# Patient Record
Sex: Female | Born: 1940 | Race: Black or African American | Hispanic: No | Smoking: Never smoker
Health system: Southern US, Community
[De-identification: ages and names within clinical notes are randomized; demographics above are authoritative.]

## PROBLEM LIST (undated history)

## (undated) DIAGNOSIS — E785 Hyperlipidemia, unspecified: Secondary | ICD-10-CM

## (undated) DIAGNOSIS — I10 Essential (primary) hypertension: Secondary | ICD-10-CM

## (undated) DIAGNOSIS — K219 Gastro-esophageal reflux disease without esophagitis: Secondary | ICD-10-CM

## (undated) DIAGNOSIS — Z86718 Personal history of other venous thrombosis and embolism: Secondary | ICD-10-CM

## (undated) DIAGNOSIS — E119 Type 2 diabetes mellitus without complications: Secondary | ICD-10-CM

## (undated) HISTORY — DX: Hyperlipidemia, unspecified: E78.5

## (undated) HISTORY — DX: Type 2 diabetes mellitus without complications: E11.9

## (undated) HISTORY — PX: CHOLECYSTECTOMY: SHX55

## (undated) HISTORY — DX: Personal history of other venous thrombosis and embolism: Z86.718

## (undated) HISTORY — PX: ABDOMINAL HYSTERECTOMY: SHX81

## (undated) HISTORY — PX: TONSILLECTOMY: SUR1361

## (undated) HISTORY — DX: Gastro-esophageal reflux disease without esophagitis: K21.9

## (undated) HISTORY — PX: SHOULDER SURGERY: SHX246

## (undated) HISTORY — DX: Essential (primary) hypertension: I10

---

## 2005-05-03 ENCOUNTER — Ambulatory Visit: Payer: Self-pay | Admitting: Nurse Practitioner

## 2005-06-21 ENCOUNTER — Emergency Department: Payer: Self-pay | Admitting: Unknown Physician Specialty

## 2005-07-13 ENCOUNTER — Ambulatory Visit: Payer: Self-pay | Admitting: Unknown Physician Specialty

## 2005-10-19 ENCOUNTER — Ambulatory Visit: Payer: Self-pay | Admitting: Unknown Physician Specialty

## 2006-05-09 ENCOUNTER — Ambulatory Visit: Payer: Self-pay | Admitting: Nurse Practitioner

## 2007-03-01 ENCOUNTER — Emergency Department: Payer: Self-pay | Admitting: Emergency Medicine

## 2007-05-12 ENCOUNTER — Ambulatory Visit: Payer: Self-pay | Admitting: Family Medicine

## 2007-05-12 ENCOUNTER — Emergency Department: Payer: Self-pay | Admitting: Internal Medicine

## 2008-02-16 ENCOUNTER — Ambulatory Visit: Payer: Self-pay | Admitting: Family Medicine

## 2008-05-11 ENCOUNTER — Ambulatory Visit: Payer: Self-pay | Admitting: Family Medicine

## 2008-05-12 ENCOUNTER — Ambulatory Visit: Payer: Self-pay | Admitting: Family Medicine

## 2008-05-20 ENCOUNTER — Ambulatory Visit: Payer: Self-pay | Admitting: Cardiovascular Disease

## 2008-06-11 ENCOUNTER — Ambulatory Visit: Payer: Self-pay | Admitting: Family Medicine

## 2008-06-22 ENCOUNTER — Ambulatory Visit: Payer: Self-pay | Admitting: Family Medicine

## 2008-07-15 ENCOUNTER — Ambulatory Visit: Payer: Self-pay | Admitting: Gastroenterology

## 2008-09-23 ENCOUNTER — Ambulatory Visit: Payer: Self-pay | Admitting: Internal Medicine

## 2009-06-16 ENCOUNTER — Emergency Department: Payer: Self-pay | Admitting: Emergency Medicine

## 2009-06-16 ENCOUNTER — Ambulatory Visit: Payer: Self-pay | Admitting: Family Medicine

## 2009-06-21 ENCOUNTER — Ambulatory Visit: Payer: Self-pay | Admitting: Family Medicine

## 2009-06-22 ENCOUNTER — Ambulatory Visit: Payer: Self-pay | Admitting: Family Medicine

## 2009-09-16 ENCOUNTER — Ambulatory Visit: Payer: Self-pay | Admitting: Internal Medicine

## 2010-01-12 ENCOUNTER — Ambulatory Visit: Payer: Self-pay | Admitting: Family Medicine

## 2010-03-06 ENCOUNTER — Ambulatory Visit: Payer: Self-pay | Admitting: Internal Medicine

## 2010-03-11 ENCOUNTER — Inpatient Hospital Stay: Payer: Self-pay | Admitting: Internal Medicine

## 2010-03-14 LAB — PROT IMMUNOELECTROPHORES(ARMC)

## 2010-03-15 LAB — CEA: CEA: 1.1 ng/mL (ref 0.0–4.7)

## 2010-04-04 ENCOUNTER — Observation Stay: Payer: Self-pay | Admitting: Internal Medicine

## 2010-04-06 ENCOUNTER — Ambulatory Visit: Payer: Self-pay | Admitting: Internal Medicine

## 2010-10-12 ENCOUNTER — Ambulatory Visit: Payer: Self-pay | Admitting: Internal Medicine

## 2010-10-31 ENCOUNTER — Ambulatory Visit: Payer: Self-pay | Admitting: Internal Medicine

## 2011-01-14 ENCOUNTER — Emergency Department: Payer: Self-pay | Admitting: Emergency Medicine

## 2011-02-14 ENCOUNTER — Ambulatory Visit: Payer: Self-pay | Admitting: Family Medicine

## 2011-02-16 ENCOUNTER — Emergency Department: Payer: Self-pay | Admitting: Emergency Medicine

## 2011-07-28 ENCOUNTER — Ambulatory Visit: Payer: Self-pay

## 2011-08-03 ENCOUNTER — Emergency Department: Payer: Self-pay | Admitting: Emergency Medicine

## 2011-10-08 ENCOUNTER — Ambulatory Visit: Payer: Self-pay

## 2011-11-03 ENCOUNTER — Emergency Department: Payer: Self-pay | Admitting: Emergency Medicine

## 2011-11-03 LAB — COMPREHENSIVE METABOLIC PANEL
Albumin: 3.9 g/dL (ref 3.4–5.0)
Alkaline Phosphatase: 60 U/L (ref 50–136)
Bilirubin,Total: 0.6 mg/dL (ref 0.2–1.0)
Calcium, Total: 9.6 mg/dL (ref 8.5–10.1)
Co2: 27 mmol/L (ref 21–32)
Creatinine: 1.1 mg/dL (ref 0.60–1.30)
EGFR (Non-African Amer.): 52 — ABNORMAL LOW
Glucose: 171 mg/dL — ABNORMAL HIGH (ref 65–99)
Osmolality: 285 (ref 275–301)
Potassium: 3.2 mmol/L — ABNORMAL LOW (ref 3.5–5.1)
SGOT(AST): 32 U/L (ref 15–37)
SGPT (ALT): 27 U/L
Total Protein: 8 g/dL (ref 6.4–8.2)

## 2011-11-03 LAB — CBC
HCT: 41.2 % (ref 35.0–47.0)
HGB: 13.6 g/dL (ref 12.0–16.0)
MCHC: 33.1 g/dL (ref 32.0–36.0)
Platelet: 319 10*3/uL (ref 150–440)
RBC: 5.16 10*6/uL (ref 3.80–5.20)

## 2011-11-21 ENCOUNTER — Ambulatory Visit: Payer: Self-pay | Admitting: Family Medicine

## 2011-12-08 ENCOUNTER — Emergency Department: Payer: Self-pay | Admitting: Emergency Medicine

## 2012-04-10 ENCOUNTER — Ambulatory Visit: Payer: Self-pay | Admitting: Specialist

## 2012-04-10 LAB — BASIC METABOLIC PANEL
BUN: 16 mg/dL (ref 7–18)
Calcium, Total: 9.7 mg/dL (ref 8.5–10.1)
Chloride: 106 mmol/L (ref 98–107)
Creatinine: 1.04 mg/dL (ref 0.60–1.30)
EGFR (African American): >60
EGFR (Non-African Amer.): 54 — ABNORMAL LOW
Glucose: 128 mg/dL — ABNORMAL HIGH (ref 65–99)
Osmolality: 286 (ref 275–301)
Sodium: 142 mmol/L (ref 136–145)

## 2012-04-10 LAB — HEMOGLOBIN: HGB: 12.5 g/dL (ref 12.0–16.0)

## 2012-04-16 ENCOUNTER — Ambulatory Visit: Payer: Self-pay | Admitting: Specialist

## 2012-04-16 LAB — PROTIME-INR: INR: 0.9

## 2012-08-06 DIAGNOSIS — Z86718 Personal history of other venous thrombosis and embolism: Secondary | ICD-10-CM

## 2012-08-06 HISTORY — PX: OTHER SURGICAL HISTORY: SHX169

## 2012-08-06 HISTORY — DX: Personal history of other venous thrombosis and embolism: Z86.718

## 2012-09-24 ENCOUNTER — Emergency Department: Payer: Self-pay | Admitting: Emergency Medicine

## 2012-09-24 LAB — CBC
HCT: 42.2 % (ref 35.0–47.0)
MCH: 26 pg (ref 26.0–34.0)
MCHC: 32.4 g/dL (ref 32.0–36.0)
RBC: 5.27 10*6/uL — ABNORMAL HIGH (ref 3.80–5.20)
WBC: 9.6 10*3/uL (ref 3.6–11.0)

## 2012-09-24 LAB — COMPREHENSIVE METABOLIC PANEL
Albumin: 3.7 g/dL (ref 3.4–5.0)
Alkaline Phosphatase: 72 U/L (ref 50–136)
Anion Gap: 9 (ref 7–16)
Bilirubin,Total: 0.9 mg/dL (ref 0.2–1.0)
Calcium, Total: 9.3 mg/dL (ref 8.5–10.1)
Chloride: 104 mmol/L (ref 98–107)
Co2: 26 mmol/L (ref 21–32)
Creatinine: 0.81 mg/dL (ref 0.60–1.30)
EGFR (Non-African Amer.): >60
Glucose: 143 mg/dL — ABNORMAL HIGH (ref 65–99)
Osmolality: 283 (ref 275–301)
Potassium: 4 mmol/L (ref 3.5–5.1)
SGPT (ALT): 29 U/L (ref 12–78)
Sodium: 139 mmol/L (ref 136–145)
Total Protein: 7.8 g/dL (ref 6.4–8.2)

## 2012-09-24 LAB — PROTIME-INR: Prothrombin Time: 20.2 secs — ABNORMAL HIGH (ref 11.5–14.7)

## 2012-09-24 LAB — URINALYSIS, COMPLETE
Ketone: NEGATIVE
Nitrite: NEGATIVE
Ph: 5 (ref 4.5–8.0)
Protein: NEGATIVE
RBC,UR: 6 /HPF (ref 0–5)

## 2012-09-26 LAB — URINE CULTURE

## 2012-10-08 ENCOUNTER — Ambulatory Visit: Payer: Self-pay | Admitting: Family Medicine

## 2012-11-17 ENCOUNTER — Ambulatory Visit: Payer: Self-pay | Admitting: Orthopedic Surgery

## 2012-12-11 ENCOUNTER — Ambulatory Visit: Payer: Self-pay | Admitting: Family Medicine

## 2012-12-25 ENCOUNTER — Ambulatory Visit: Payer: Self-pay | Admitting: Orthopedic Surgery

## 2012-12-30 ENCOUNTER — Ambulatory Visit: Payer: Self-pay | Admitting: Orthopedic Surgery

## 2013-02-04 ENCOUNTER — Emergency Department: Payer: Self-pay | Admitting: Emergency Medicine

## 2013-02-04 LAB — COMPREHENSIVE METABOLIC PANEL
BUN: 12 mg/dL (ref 7–18)
Calcium, Total: 9.4 mg/dL (ref 8.5–10.1)
Creatinine: 0.89 mg/dL (ref 0.60–1.30)
EGFR (African American): >60
EGFR (Non-African Amer.): >60
Potassium: 3.9 mmol/L (ref 3.5–5.1)
Sodium: 142 mmol/L (ref 136–145)
Total Protein: 7.3 g/dL (ref 6.4–8.2)

## 2013-02-04 LAB — CBC
HCT: 36.2 % (ref 35.0–47.0)
HGB: 11.9 g/dL — ABNORMAL LOW (ref 12.0–16.0)
MCH: 25.8 pg — ABNORMAL LOW (ref 26.0–34.0)
MCHC: 32.8 g/dL (ref 32.0–36.0)
MCV: 79 fL — ABNORMAL LOW (ref 80–100)
Platelet: 283 x10 3/mm 3 (ref 150–440)
RBC: 4.6 X10 6/mm 3 (ref 3.80–5.20)
RDW: 15.4 % — ABNORMAL HIGH (ref 11.5–14.5)
WBC: 7.3 x10 3/mm 3 (ref 3.6–11.0)

## 2013-02-04 LAB — PROTIME-INR: INR: 1.5

## 2013-09-11 IMAGING — CR DG CHEST 2V
1 series · 3 of 3 positions shown · non-contrast
Comparison: none

REASON FOR EXAM: htn
COMMENTS:

PROCEDURE:     DXR - DXR CHEST PA (OR AP) AND LATERAL  - December 25, 2012  [DATE]
RESULT:     Comparison: 04/03/2010

[Series 1: w chest pa · 0.14mm/px · 3 of 3 slices shown]
[im 1/3]
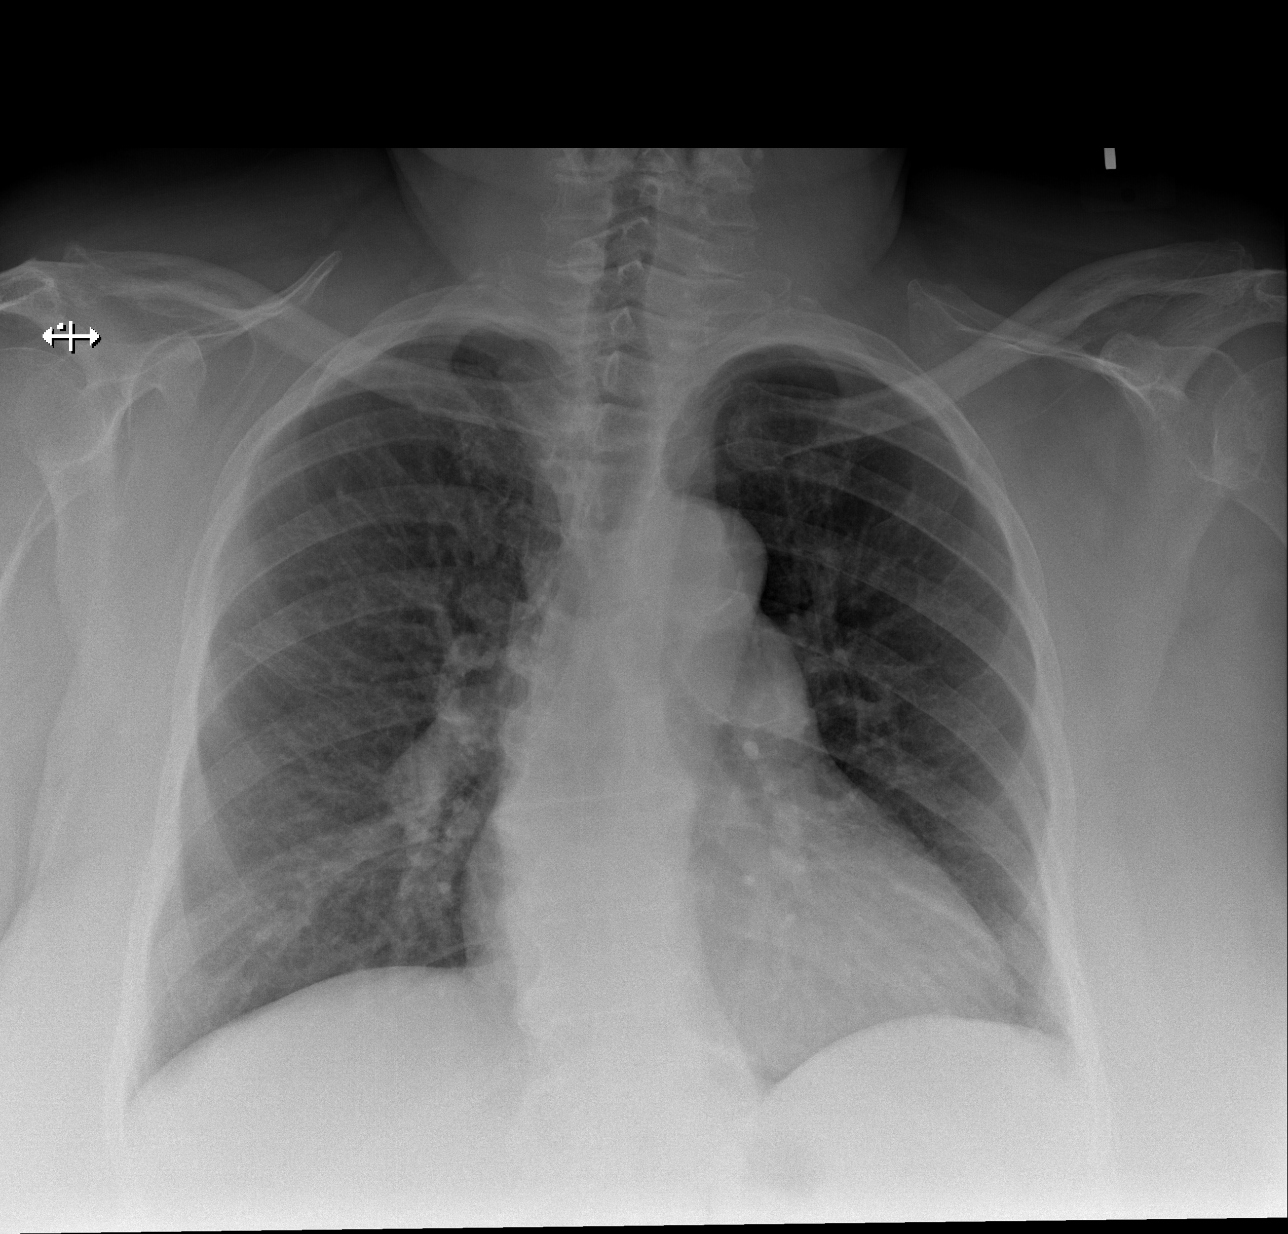
[im 2/3]
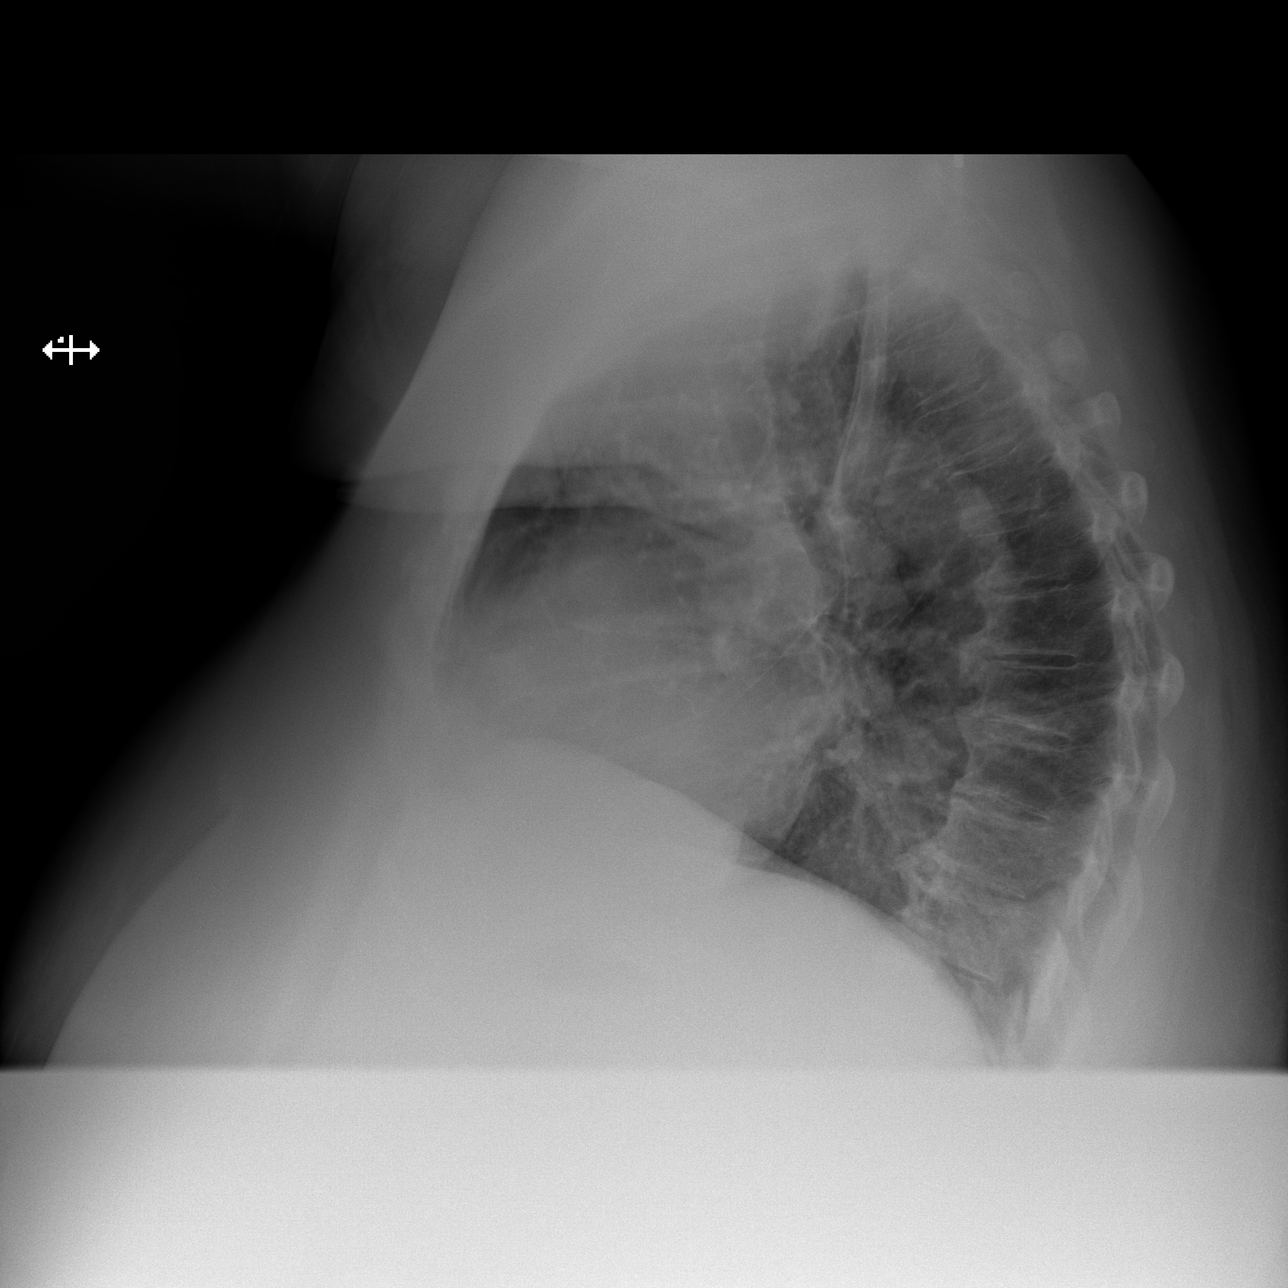
[im 3/3]
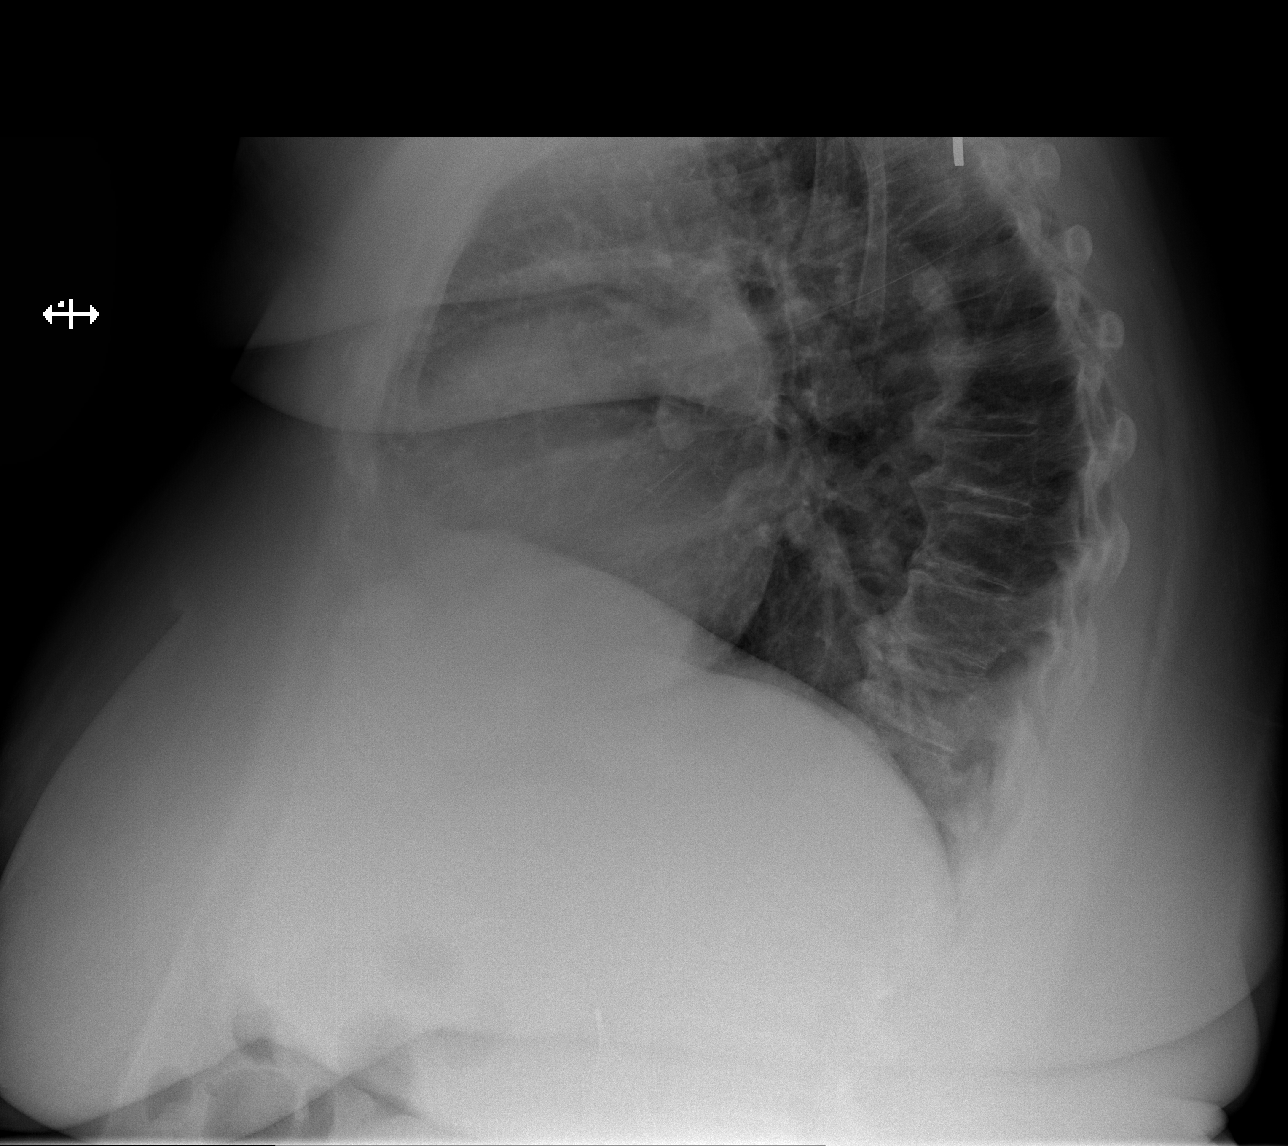

[3 of 3 positions shown; findings below may reference images not displayed]

FINDINGS: The heart and mediastinum are stable. Mild prominence of the pulmonary
interstitium is felt to be technical. No focal pulmonary opacities. There is
a mild dextrocurvature of the thoracic spine. An IVC filter is incompletely
visualized.
IMPRESSION: No acute cardiopulmonary disease.

[REDACTED]

## 2013-11-24 ENCOUNTER — Inpatient Hospital Stay: Payer: Self-pay | Admitting: Internal Medicine

## 2013-11-24 LAB — COMPREHENSIVE METABOLIC PANEL
ALT: 24 U/L (ref 12–78)
Albumin: 3.7 g/dL (ref 3.4–5.0)
Alkaline Phosphatase: 57 U/L
Anion Gap: 3 — ABNORMAL LOW (ref 7–16)
BILIRUBIN TOTAL: 0.6 mg/dL (ref 0.2–1.0)
BUN: 18 mg/dL (ref 7–18)
CALCIUM: 9.1 mg/dL (ref 8.5–10.1)
CO2: 29 mmol/L (ref 21–32)
Chloride: 111 mmol/L — ABNORMAL HIGH (ref 98–107)
Creatinine: 0.71 mg/dL (ref 0.60–1.30)
EGFR (Non-African Amer.): >60
GLUCOSE: 79 mg/dL (ref 65–99)
OSMOLALITY: 286 (ref 275–301)
Potassium: 3.4 mmol/L — ABNORMAL LOW (ref 3.5–5.1)
SGOT(AST): 22 U/L (ref 15–37)
Sodium: 143 mmol/L (ref 136–145)
Total Protein: 7 g/dL (ref 6.4–8.2)

## 2013-11-24 LAB — URINALYSIS, COMPLETE
BILIRUBIN, UR: NEGATIVE
GLUCOSE, UR: NEGATIVE mg/dL (ref 0–75)
KETONE: NEGATIVE
Nitrite: NEGATIVE
PH: 6 (ref 4.5–8.0)
Protein: NEGATIVE
RBC,UR: 3 /HPF (ref 0–5)
SPECIFIC GRAVITY: 1.02 (ref 1.003–1.030)
Squamous Epithelial: 1
WBC UR: 12 /HPF (ref 0–5)

## 2013-11-24 LAB — CBC
HCT: 40 % (ref 35.0–47.0)
HGB: 13 g/dL (ref 12.0–16.0)
MCH: 26.4 pg (ref 26.0–34.0)
MCHC: 32.5 g/dL (ref 32.0–36.0)
MCV: 81 fL (ref 80–100)
Platelet: 283 10*3/uL (ref 150–440)
RBC: 4.92 10*6/uL (ref 3.80–5.20)
RDW: 15.1 % — AB (ref 11.5–14.5)
WBC: 8.3 10*3/uL (ref 3.6–11.0)

## 2013-11-24 LAB — LIPASE, BLOOD: LIPASE: 196 U/L (ref 73–393)

## 2013-11-24 LAB — PROTIME-INR
INR: 3
Prothrombin Time: 30.6 secs — ABNORMAL HIGH (ref 11.5–14.7)

## 2013-11-24 LAB — TROPONIN I: Troponin-I: <0.02 ng/mL

## 2013-11-24 LAB — MAGNESIUM: Magnesium: 2 mg/dL

## 2013-11-25 LAB — COMPREHENSIVE METABOLIC PANEL
ALK PHOS: 51 U/L
AST: 20 U/L (ref 15–37)
Albumin: 3.1 g/dL — ABNORMAL LOW (ref 3.4–5.0)
Anion Gap: 4 — ABNORMAL LOW (ref 7–16)
BILIRUBIN TOTAL: 0.8 mg/dL (ref 0.2–1.0)
BUN: 14 mg/dL (ref 7–18)
Calcium, Total: 8.3 mg/dL — ABNORMAL LOW (ref 8.5–10.1)
Chloride: 114 mmol/L — ABNORMAL HIGH (ref 98–107)
Co2: 27 mmol/L (ref 21–32)
Creatinine: 0.67 mg/dL (ref 0.60–1.30)
EGFR (African American): >60
EGFR (Non-African Amer.): >60
GLUCOSE: 89 mg/dL (ref 65–99)
Osmolality: 289 (ref 275–301)
Potassium: 3.5 mmol/L (ref 3.5–5.1)
SGPT (ALT): 19 U/L (ref 12–78)
Sodium: 145 mmol/L (ref 136–145)
TOTAL PROTEIN: 6.1 g/dL — AB (ref 6.4–8.2)

## 2013-11-25 LAB — PROTIME-INR
INR: 2
PROTHROMBIN TIME: 22.6 s — AB (ref 11.5–14.7)

## 2013-11-25 LAB — CBC WITH DIFFERENTIAL/PLATELET
Basophil #: 0.1 10*3/uL (ref 0.0–0.1)
Basophil %: 1.4 %
EOS ABS: 0.2 10*3/uL (ref 0.0–0.7)
EOS PCT: 2.1 %
HCT: 35.7 % (ref 35.0–47.0)
HGB: 11.6 g/dL — ABNORMAL LOW (ref 12.0–16.0)
Lymphocyte #: 3.6 10*3/uL (ref 1.0–3.6)
Lymphocyte %: 46.8 %
MCH: 26.2 pg (ref 26.0–34.0)
MCHC: 32.5 g/dL (ref 32.0–36.0)
MCV: 81 fL (ref 80–100)
Monocyte #: 0.5 x10 3/mm (ref 0.2–0.9)
Monocyte %: 6.3 %
NEUTROS ABS: 3.3 10*3/uL (ref 1.4–6.5)
Neutrophil %: 43.4 %
PLATELETS: 244 10*3/uL (ref 150–440)
RBC: 4.43 10*6/uL (ref 3.80–5.20)
RDW: 14.7 % — AB (ref 11.5–14.5)
WBC: 7.6 10*3/uL (ref 3.6–11.0)

## 2013-11-25 LAB — HEMOGLOBIN: HGB: 13 g/dL (ref 12.0–16.0)

## 2013-11-25 LAB — TROPONIN I: Troponin-I: <0.02 ng/mL

## 2013-11-26 LAB — PROTIME-INR
INR: 1.2
Prothrombin Time: 15.2 secs — ABNORMAL HIGH (ref 11.5–14.7)

## 2013-11-26 LAB — HEMOGLOBIN: HGB: 12.5 g/dL (ref 12.0–16.0)

## 2013-11-26 LAB — CLOSTRIDIUM DIFFICILE(ARMC)

## 2013-11-27 HISTORY — PX: COLONOSCOPY: SHX174

## 2013-11-27 LAB — HEMOGLOBIN: HGB: 13 g/dL (ref 12.0–16.0)

## 2013-11-28 LAB — STOOL CULTURE

## 2013-12-01 LAB — PATHOLOGY REPORT

## 2014-01-08 ENCOUNTER — Ambulatory Visit: Payer: Self-pay | Admitting: Family Medicine

## 2014-06-11 ENCOUNTER — Encounter: Payer: Self-pay | Admitting: *Deleted

## 2014-07-08 ENCOUNTER — Encounter: Payer: Self-pay | Admitting: General Surgery

## 2014-07-08 ENCOUNTER — Ambulatory Visit (INDEPENDENT_AMBULATORY_CARE_PROVIDER_SITE_OTHER): Payer: Medicare HMO | Admitting: General Surgery

## 2014-07-08 VITALS — BP 142/78 | HR 76 | Resp 16 | Ht 61.0 in | Wt 257.0 lb

## 2014-07-08 DIAGNOSIS — R109 Unspecified abdominal pain: Secondary | ICD-10-CM

## 2014-07-08 NOTE — Progress Notes (Signed)
Patient ID: Hannah Robertson, female   DOB: [birth year], 73 y.o.   MRN: [id number]  Chief Complaint  Patient presents with  . Other    right flank pain    HPI Hannah MalesDorothy V Robertson is a 73 y.o. female who presents for an evaluation of right flank pain. The patient was last seen for this in 2012. She states the pain is described as a cramping sensation. She has this cramping sensation on a weekly basis. She states the soreness is there all the time. The soreness is radiates to her back and down the back of her right leg. She has not stopped her from what she typically does on a regular basis. She has tried using Tylenol to relieve the pain. This has helped. The pain has been there for approximately 12 months. The pain has become more consistent.  Walking makes the right leg weak and it hurts more.  She has made use of a motorized scooter when going to the grocery store. HPI  Past Medical History  Diagnosis Date  . Hypertension   . Hyperlipidemia   . GERD (gastroesophageal reflux disease)   . Diabetes mellitus without complication   . H/O blood clots 2014    Past Surgical History  Procedure Laterality Date  . Abdominal hysterectomy    . Cesarean section    . Colonoscopy  11/27/13  . Shoulder surgery    . Cholecystectomy    . Tonsillectomy    . Ivc filter  2014    History reviewed. No pertinent family history.  Social History History  Substance Use Topics  . Smoking status: Never Smoker   . Smokeless tobacco: Not on file  . Alcohol Use: No    No Known Allergies  Current Outpatient Prescriptions  Medication Sig Dispense Refill  . acetaminophen (TYLENOL) 325 MG tablet Take 325 mg by mouth every 6 (six) hours as needed.    Marland Kitchen. amLODipine (NORVASC) 10 MG tablet Take 10 mg by mouth daily.     . fluticasone (FLONASE) 50 MCG/ACT nasal spray Place 2 sprays into both nostrils daily.     Marland Kitchen. LANTUS SOLOSTAR 100 UNIT/ML Solostar Pen Inject 32 Units into the skin 2 (two) times daily.     Marland Kitchen.  losartan-hydrochlorothiazide (HYZAAR) 50-12.5 MG per tablet Take 1 tablet by mouth daily.     . pravastatin (PRAVACHOL) 40 MG tablet Take 40 mg by mouth daily.     Marland Kitchen. warfarin (COUMADIN) 2.5 MG tablet      No current facility-administered medications for this visit.    Review of Systems Review of Systems  Constitutional: Negative.   Respiratory: Negative.   Cardiovascular: Negative.     Blood pressure 142/78, pulse 76, resp. rate 16, height 5\' 1"  (1.549 m), weight 257 lb (116.574 kg).  Physical Exam Physical Exam  Constitutional: She is oriented to person, place, and time. She appears well-developed and well-nourished.  Neck: Neck supple. No thyromegaly present.  Cardiovascular: Normal rate, regular rhythm, normal heart sounds and normal pulses.   No murmur heard. Pulses:      Femoral pulses are 2+ on the right side, and 2+ on the left side.      Posterior tibial pulses are 2+ on the right side, and 2+ on the left side.  No lower extremity edema is identified.  Pulmonary/Chest: Effort normal and breath sounds normal.  Abdominal: Soft. Normal appearance and bowel sounds are normal. There is no hepatosplenomegaly. No hernia.  Lymphadenopathy:    She has  no cervical adenopathy.  Neurological: She is alert and oriented to person, place, and time.  Skin: Skin is warm and dry.    Data Reviewed CT of the abdomen and pelvis dated 09/24/2012 obtained for reports of right flank pain completed through the emergency department showed a small hiatal hernia, status post cholecystectomy, inferior vena cava filter present. Stable adrenal nodules.  Review of the films with special attention to the abdominal wall showed no evidence of hernias.   Assessment    Benign clinical exam.    Plan    At this time I have no source for the patient reported pain. She has had a 27 pound weight loss since her August 2012 exam. In spite of this she is noted Fehling exercise tolerance. No evidence of  lower extremity vascular disease on testing at rest. She has a generous abdominal panniculus and this accounts for the majority of mass effect appreciated bilaterally.  She was encouraged to follow up with her primary care physician to determine if further diagnostic studies regarding the lumbar spine would be appropriate.    PCP/Ref MD:  Wilford CornerGrandis,Heidi   Rozlyn Yerby W 07/09/2014, 8:25 PM

## 2014-07-08 NOTE — Patient Instructions (Signed)
The patient is aware to call back for any questions or concerns.  

## 2014-07-09 DIAGNOSIS — R109 Unspecified abdominal pain: Secondary | ICD-10-CM | POA: Insufficient documentation

## 2014-07-09 DIAGNOSIS — R10A1 Flank pain, right side: Secondary | ICD-10-CM | POA: Insufficient documentation

## 2014-11-23 NOTE — Op Note (Signed)
PATIENT NAME:  Hannah Robertson, Hannah Robertson  DATE OF PROCEDURE:  04/16/2012  PREOPERATIVE DIAGNOSIS: Severe right carpal tunnel syndrome.   POSTOPERATIVE DIAGNOSIS: Severe right carpal tunnel syndrome.   PROCEDURE PERFORMED: Right carpal tunnel release.   SURGEON: Valinda HoarHoward E. Jamey Demchak, M.D.   ANESTHESIA: General LMA.   COMPLICATIONS: None.   DRAINS: None.   ESTIMATED BLOOD LOSS: None.  DESCRIPTION OF PROCEDURE: The patient was brought to the Operating Room where she underwent satisfactory general LMA anesthesia in the supine position. The right arm was prepped and draped in sterile fashion and an Esmarch applied. The tourniquet was inflated to 250 mmHg. A longitudinal incision was made in the palm using the long palmar crease. Dissection was carried out bluntly through subcutaneous tissue exposing the distal aspect of the volar carpal ligament. A Kelly clamp was passed beneath this and spread to release adhesions between the nerve and the ligament. Carpal tunnel scissors were used to debride soft tissue on the volar aspect of the ligament. A mini blade knife was then used to release the volar ligament under direct vision over the Kelly clamp. Carpal tunnel scissors were used to finish this proximally. The nerve was pale and flattened and branched early. The ligament was quite thickened. The nerve was freed up from adhesions with a mosquito clamp. The motor branch was intact. The distal aspect of Guyon's canal was unroofed under direct vision. The wound was then irrigated and closed with running 4-0 nylon suture. 0.5% Marcaine was placed in the wound and a dry sterile compression hand dressing and volar splint were applied. The tourniquet was deflated     with good return of blood flow to the hand, and the patient was taken to recovery in good condition. The patient will restart her Coumadin tonight. She had stopped her Coumadin five days ago and her INR was 0.9 this  morning. ____________________________ Valinda HoarHoward E. Robie Oats, MD hem:slb D: 04/16/2012 08:21:00 ET T: 04/16/2012 09:35:25 ET JOB#: [id number]  cc: Valinda HoarHoward E. Ester Mabe, MD, <Dictator> Lucillie GarfinkelEmma R. Mayford KnifeWilliams, MD Valinda HoarHOWARD E Ernestine Rohman MD ELECTRONICALLY SIGNED 04/17/2012 12:50

## 2014-11-26 NOTE — Op Note (Signed)
PATIENT NAME:  Hannah Robertson, Hannah Robertson MR#:  [id number] DATE OF BIRTH:  1940-12-07  DATE OF PROCEDURE:  12/30/2012  PREOPERATIVE DIAGNOSIS: Left shoulder chronic rotator cuff tear, subacromial bursitis, impingement syndrome.  POSTOPERATIVE DIAGNOSIS: Left shoulder chronic rotator cuff tear, subacromial bursitis, impingement syndrome.  PROCEDURE PERFORMED: 1. Diagnostic arthroscopy, left shoulder.  2. Arthroscopic extensive bursectomy, lysis of adhesions, subacromial decompression.  3. Mini open rotator cuff repair.   SURGEON: Murlean HarkShalini Iantha Titsworth, MD  ASSISTANT: Sonny DandyJ. Todd Mundy, PA  ESTIMATED BLOOD LOSS: Minimal.   TOURNIQUET TIME: None.   IMPLANTS USED:  1. Arthrex suture anchor BioComposite SwiveLock 4.75 x 19.1 x2.  2. Arthrex suture anchor Corkscrew FT2 5.5 x 16.3 x2.   COMPLICATIONS: No immediate intraoperative or postoperative complications noted.   ANESTHESIA: Interscalene block and general anesthesia.   DISPOSITION: The patient will be kept in the hospital overnight for pain control. She will be discharged home the day after surgery. She will follow up in the office on Friday. She will begin physical therapy at that time.   INDICATIONS FOR PROCEDURE: [name] is a 74 year old female who presented as an outpatient for chronic pain and disability of the left shoulder. She sustained an injury approximately 14 to 15 months prior to date of surgery. She states that at this point the pain and dysfunction has gotten so severe that she wishes to proceed with an attempt at surgical repair. She is aware that with the chronic nature of this injury that there is a higher incidence of failure and inability to repair the cuff. Risks and benefits are explained.   DESCRIPTION OF PROCEDURE: [name] is identified in the preoperative holding area. Left shoulder is identified as the operative site. She is brought into the operating room, and interscalene block was administered. General anesthesia was  administered. The patient was positioned on operating room table in a beach chair position. The head was secured. Torso was secured. All bony prominences were adequately padded. Left upper extremity was prepared and draped in the usual sterile fashion. A timeout was performed, identifying the patient, procedure, extremity, laterality, IV antibiotics and confirming consent and imaging accuracy.   A standard viewing portal was made. Arthroscope was inserted into the glenohumeral joint. There was extensive tissue fraying and scarring. Biceps tendon was completely torn and scarred to the subscapularis. Extensive labral fraying was noted. A large rotator cuff tear was noted of the supraspinatus and infraspinatus muscle. Mild glenohumeral chondral damage. Anterior incision was made under direct visualization, and a shaver was inserted. Biceps was gently freed from the subscapularis. The subscapularis was probed and rotated. It was found to be intact. Scarred biceps was debrided from the remainder of the anterior shoulder. The extensive fraying and scarring of the rotator cuff was cleaned from within the joint. At this time, attempt was made to place the camera through a posterior portal into the subacromial space. There appeared to be bony block preventing entry into the subacromial space. A lateral portal was made with the camera still inside the joint. A shaver was introduced through the rotator cuff tear into the joint. The remainder of the cuff was cleaned. Through the same lateral portal, the arthroscope was inserted into the subacromial space. Through the anterior portal, a shaver was inserted into the subacromial space. There was extensive thick scarring of the rotator cuff to the acromion. With a combination of blunt dissection and shaver, much of the cuff was able to ultimately be freed from the overlying acromion. This then  revealed a large acromial spur. An acromion burr was inserted, and the acromion was  leveled. There was still extensive scar posteriorly, preventing posterior entry into the subacromial space. Extensive scar was again freed, further mobilizing the cuff. At this time, the lateral incision was extended, and deltoid muscle was bluntly split. Underlying bursa was still noted posteriorly. This was dissected away. Rotator cuff was readily identifiable under the acromion. A KingFisher was used to grasp the rotator cuff. Rotator cuff was partially mobile. Manual dissection was carried out over the posterior aspect of the cuff to further free the tissue from scar. At this time, the cuff was fully mobile and able to readily cover the footprint. Footprint was already prepared arthroscopically; however, it was rasped through the open incision. Two Corkscrew anchors were placed in the medial row. Using a Scorpion under direct visualization, all 8 suture limbs were passed in an anterior-to-posterior direction. Sutures were tied in horizontal mattress configuration from posterior to anterior. The cuff very nicely covered the footprint. At this time, a decision was made to also use a lateral row. One suture limb from each knot was secured through a Corkscrew in the posterior anchor of the lateral row, and the remaining 4 sutures were secured in a similar manner in the anterior aspect of the lateral row. The additional screw in the posterior SwiveLock was passed through a small area with a dog ear in the posterior aspect of the cuff repair, and this was nicely tied down, reducing the cuff completely. Shoulder was gently moved, and cuff readily moved in 1 unit. At this time, all wounds were copiously irrigated. Deltoid fascia was reapproximated using 0 Vicryl. Subcutaneous tissue was closed using 2-0 Vicryl. Skin was closed using nylon suture. Sterile dressings were applied. TENS unit was applied. Polar Care was applied. The patient was placed in an UltraSling with a bolster pillow. She will be admitted overnight for  pain control.    ____________________________ Murlean Hark, MD sr:OSi D: 01/01/2013 12:58:21 ET T: 01/01/2013 13:25:14 ET JOB#: [id number]  cc: Murlean Hark, MD, <Dictator> Murlean Hark MD ELECTRONICALLY SIGNED 01/05/2013 14:17

## 2014-11-27 NOTE — H&P (Signed)
PATIENT NAME:  Hannah Robertson, Hannah Robertson MR#:  [id number] DATE OF BIRTH:  1940/10/12  DATE OF ADMISSION:  11/24/2013  PRIMARY CARE PHYSICIAN: [facility].  The patient is a 74 year old African American female with past medical history significant for history of recurrent DVT, pulmonary embolism, who is on Coumadin therapy for life, who presents to the hospital with complaints of rectal bleed. According to the patient, she was doing well up until the afternoon on day of admission when she went to the bathroom. She strained some, since she did not have any bowel movement yesterday, and when she looked in the commode, there was bright red blood in the commode. She does not know exactly how much blood she passed; however, she had also a small amount of bowel movement. She felt somewhat a little weak and dizzy and decided to come to the Emergency Room for further evaluation. In the Emergency Room, she was noted to be coagulopathic with pro time of 30.6, INR of 3.0. Her  hemoglobin level was found to be 13.0. She has not bled here in the Emergency Room. Emergency Room physician had rectal exam done, which revealed some blood in the vault. Hospitalist services were contacted for admission.   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   PAST SURGICAL HISTORY: IVC filter placement, appendectomy, tonsillectomy, cholecystectomy, as well as hysterectomy, also rotator cuff tear, impingement syndrome, status post open subacromial decompression, May 2014; history of right carpal tunnel surgery in September 2013. According to the patient, she also has chronic abdominal pains as well as  diarrhea, questionable irritable bowel syndrome versus anxiety related.   MEDICATIONS:  Unknown, however, the patient in the past was on amlodipine 10 mg p.o. daily, clonidine 0.4 mg p.o. twice daily, hydrochlorothiazide 25 mg p.o. daily, Lantus insulin 32 units subcutaneously twice daily, losartan 50 mg twice daily, pravastatin 40 mg p.o. daily, warfarin 2.5 mg 2 tablets, which will be 5 mg on Monday, Tuesday, Wednesday, Thursday, as well as Friday and 7.5 on Saturday and Sunday.    ALLERGIES: No known drug allergies.   SOCIAL HISTORY:  Does not work.  No smoking or alcohol abuse. Lives at home with family members.   FAMILY HISTORY: Hypertension, as well as diabetes mellitus.   REVIEW OF SYSTEMS:  CONSTITUTIONAL:  Positive for fatigue and weakness, feeling dizzy, some pain around the abdomen, some pelvic cramping pain, weight loss, approximately 30 pounds since May 2014; bifocal glasses, seasonal allergies, some cough with no sputum production, arrhythmias intermittently, also some nausea earlier today, rectal bleeding. Last bowel movement was approximately 2 days ago, as well as earlier today with rectal bleeding. Colonoscopy long time ago by Dr. Mechele CollinElliott. She was due to for colonoscopy, however, did not follow up with Dr. Mechele CollinElliott since she had numerous operations recently.  Denies any fevers, weight gain.  EYES: Denies any blurry vision, double vision, glaucoma or cataracts.  EARS, NOSE, THROAT: Denies tinnitus, epistaxis, sinus pain, dentures or difficulty swallowing. RESPIRATORY:  Denies any wheezes, asthma, COPD.  CARDIOVASCULAR: Denies chest pains, orthopnea, edema, palpitations or syncope.  GASTROINTESTINAL: Denies any vomiting, diarrhea. Admits to rectal bleeding.  Denies any hematemesis or change in bowel habits.  GENITOURINARY: Denies dysuria, hematuria, frequency or  incontinence.  ENDOCRINE: Denies any polydipsia, nocturia, thyroid problems, heat or cold intolerance or thirst.   HEMATOLOGIC: Denies anemia, easy bruising or swollen glands.  SKIN: Denies any acne, rashes or change in moles.  MUSCULOSKELETAL: Denies arthritis, cramps, swelling. NEUROLOGIC:  No numbness, epilepsy or tremor.  PSYCHIATRIC: Denies anxiety, insomnia or depression.   PHYSICAL EXAMINATION: VITAL SIGNS: On arrival to the hospital, the patient's temperature was 98, pulse was 76, respirations 20, blood pressure 210/92, saturation was 97% on room air.  GENERAL: This is well-developed, well-nourished, obese African American female in no significant distress, laying on the stretcher.  HEENT: Her pupils are equal, reactive to light. Extraocular muscles intact. No icterus or conjunctivitis. Has normal hearing. No pharyngeal edema. Mucosa is moist.  NECK: No masses. Supple, nontender. Thyroid not enlarged. No adenopathy. No JVD or carotid bruits bilaterally. Full range of motion.  LUNGS: Clear to auscultation in all fields. Some diminished breath sounds in the posterior aspect in the lower lung. No wheezing. No labored inspirations, increased effort, dullness to percussion or overt respiratory distress.  CARDIOVASCULAR: S1, S2 appreciated. Rhythm is regular. PMI not lateralized. Chest is nontender to palpation. 1+ pedal pulses. No lower extremity edema, calf tenderness or cyanosis was noted.  ABDOMEN: Soft, minimally tender in the epigastrium area, but no rebound or guarding were noted. No hepatosplenomegaly or masses were noted.  RECTAL: Deferred.  MUSCULOSKELETAL: Muscle strength:  Able to move all extremities. No cyanosis, degenerative joint disease or kyphosis. Gait was not tested.  SKIN: Did not reveal any rashes, lesions, erythema, nodularity or induration. It was warm and dry to palpation.  LYMPHATIC: No adenopathy in the cervical region.  NEUROLOGIC: Cranial nerves grossly intact. Sensory is intact. No dysarthria or aphasia.  PSYCHIATRIC:  The patient is alert, oriented to time, person and place,  cooperative. Memory is good. No significant confusion, agitation or depression was noted.   LABORATORY DATA: BMP showed a potassium of 3.4, otherwise unremarkable. Lipase level 196. Liver enzymes were normal. CBC: White blood cell count 8.3, hemoglobin 13.0, platelet count 283. Pro time 30.6, INR was 3.0.  Urinalysis: Yellow clear urine, negative for glucose, bilirubin or ketones. Specific gravity 1.020, pH was 6.0, 1+ blood, negative for protein, no nitrites, trace leukocyte esterase, 3 red blood cells, 12 white blood cells, trace bacteria was seen, 1 epithelial cell, as well as mucus was present.   RADIOLOGIC STUDIES: None.   ASSESSMENT AND PLAN: 1.  Hematochezia. Admit the patient to medical floor. We will be checking the patient's hemoglobin level every 8 hours, and we will get a bleeding scan if bleeding has recurred.  We will continue the patient's clear liquid diet, and we will get gastroenterology consultation for possible colonoscopy.  2.  Coagulopathy. We will give vitamin K.  We will hold Coumadin for now. The patient will need to resume her Coumadin as soon as bleeding source is known and she is out from the danger.  3.  Malignant hypertension. We will resume the patient's  medications. Unfortunately, the patient's medications list is not known; however, we will give her whatever she had in the past.  4.  Hypokalemia. We will supplement orally. We will get magnesium level. I discussed with the patient possible transfusion. She was explained about the risks, as well as benefits, and she was understanding and she was agreeable for transfusion.   TIME SPENT:  One hour.    ____________________________ Katharina Caper, MD rv:dmm D: 11/24/2013 19:32:44 ET T: 11/24/2013 20:10:07 ET JOB#:  [id number]  cc: Katharina Caper, MD, <Dictator> [facility] Elgar Scoggins MD ELECTRONICALLY SIGNED 12/29/2013 21:44

## 2014-11-27 NOTE — Consult Note (Signed)
Chief Complaint:  Subjective/Chief Complaint seen for rectal bleeding.  denies bleeding today, no nausea or abdominal pain.   VITAL SIGNS/ANCILLARY NOTES: **Vital Signs.:   23-Apr-15 13:48  Vital Signs Type Routine  Temperature Temperature (F) 98.3  Celsius 36.8  Temperature Source oral  Pulse Pulse 71  Respirations Respirations 18  Systolic BP Systolic BP 180  Diastolic BP (mmHg) Diastolic BP (mmHg) 81  Mean BP 114  Pulse Ox % Pulse Ox % 97  Pulse Ox Activity Level  At rest  Oxygen Delivery Room Air/ 21 %    18:13  Vital Signs Type Recheck  Systolic BP Systolic BP 152  Diastolic BP (mmHg) Diastolic BP (mmHg) 74  Mean BP 100  *Intake and Output.:   23-Apr-15 10:05  Stool  BM medium watery dark   Brief Assessment:  GEN no acute distress, obese   Cardiac Regular   Respiratory clear BS   Gastrointestinal details normal Soft  Nontender  Nondistended  Bowel sounds normal   Lab Results: Routine Micro:  23-Apr-15 09:37   Micro Text Report CLOSTRIDIUM DIFFICILE   C.DIFFICILE ANTIGEN       C.DIFFICILE GDH ANTIGEN : NEGATIVE   C.DIFFICILE TOXIN A/B     C.DIFFICILE TOXINS A AND B : NEGATIVE   INTERPRETATION            Negative for C. difficile.    ANTIBIOTIC                        Routine Chem:  21-Apr-15 17:45   BUN 18  22-Apr-15 02:00   BUN 14  Routine Coag:  23-Apr-15 04:24   Prothrombin  15.2  INR 1.2 (INR reference interval applies to patients on anticoagulant therapy. A single INR therapeutic range for coumarins is not optimal for all indications; however, the suggested range for most indications is 2.0 - 3.0. Exceptions to the INR Reference Range may include: Prosthetic heart valves, acute myocardial infarction, prevention of myocardial infarction, and combinations of aspirin and anticoagulant. The need for a higher or lower target INR must be assessed individually. Reference: The Pharmacology and Management of the Vitamin K  antagonists: the seventh ACCP  Conference on Antithrombotic and Thrombolytic Therapy. Chest.2004 Sept:126 (3suppl): [id number]. A HCT value >55% may artifactually increase the PT.  In one study,  the increase was an average of 25%. Reference:  "Effect on Routine and Special Coagulation Testing Values of Citrate Anticoagulant Adjustment in Patients with High HCT Values." American Journal of Clinical Pathology 2006;126:400-405.)  Routine Hem:  21-Apr-15 17:45   Hemoglobin (CBC) 13.0  Platelet Count (CBC) 283 (Result(s) reported on 24 Nov 2013 at 06:30PM.)  22-Apr-15 02:00   Hemoglobin (CBC)  11.6  Platelet Count (CBC) 244    10:10   Hemoglobin (CBC) 13.0 (Result(s) reported on 25 Nov 2013 at 10:28AM.)  23-Apr-15 04:24   Hemoglobin (CBC) 12.5 (Result(s) reported on 26 Nov 2013 at 05:28AM.)   Assessment/Plan:  Assessment/Plan:  Assessment 1)rectal bleeding-none over 24 hours, no abdominal pain, hgb stable without tfx. H/O coumadin use for recurrent DVT.  impression anal outlet bleeding although history of colon polyps, overdue for colonoscopy.   Plan 1) as patient is off coumadin with normalized INR, will prep for colonoscopy tomorrow. I have discussed the risksk benefits and complications of colonoscopy to include not limited to bleeding infection perforation and sedation and she wishes to proceed. further recs to follow.   Electronic Signatures: Barnetta Chapel (MD)  (Signed 23-Apr-15 20:34)  Authored: Chief Complaint, VITAL SIGNS/ANCILLARY NOTES, Brief Assessment, Lab Results, Assessment/Plan   Last Updated: 23-Apr-15 20:34 by Barnetta ChapelSkulskie, Taygan Connell (MD)

## 2014-11-27 NOTE — Consult Note (Signed)
PATIENT NAME:  Hannah Robertson, Duaa V MR#:  [id number] DATE OF BIRTH:  July 11, 1941  DATE OF CONSULTATION:  11/25/2013  GASTROENTEROLOGY CONSULTATION  REFERRING PHYSICIAN:  Dr. Winona LegatoVaickute CONSULTING PHYSICIAN:  Hardie ShackletonKaryn M. Colin BentonEarle, PA-C  ATTENDING GASTROENTEROLOGIST:  Dr. Barnetta ChapelMartin Skulskie  REASON FOR CONSULTATION: Rectal bleeding.   HISTORY OF PRESENT ILLNESS: This is a pleasant 74 year old African American female accompanied by several family members in the room who initially sat down to use the restroom at home and had been experiencing an episode of rectal bleeding. She had the sensation that she had to strain and when she turned around it was just rectal bleeding without accompanying stool. Since that time she has noticed rectal bleeding again and this was accompanied with small amount of stool. There has not been any clear blood mixed into the consistency and there has been no clear melena. She has not had any real rectal discomfort or abdominal pain. No nausea, vomiting or indigestion. No dysphagia. She has been admitted and her hemoglobin has been stable throughout hospitalization, today with a hemoglobin of 13. The last time she saw blood was last night and she has not had any further bleeding today. No diarrhea or constipation. No unintentional weight changes. No chest pain or shortness of breath. Last colonoscopy was in 2007, notable for internal hemorrhoids and she does have a personal history of colon polyps. She was contacted several times to repeat this exam and the patient admits that she has canceled and postponed repeating this procedure and she is aware that she needs to have this repeated. Last EGD was in 2007 and both of her procedures were performed by Dr. Mechele CollinElliott. 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 White blood cells are normal. Hemoglobin is 13 and stable. No unintentional weight changes.   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   ALLERGIES: No known drug allergies.   PAST SURGICAL HISTORY: IVC filter placement in 2011, appendectomy, tonsillectomy, cholecystectomy, hysterectomy, rotator cuff repair, right carpal tunnel release.   MEDICATIONS: Amlodipine, clonidine, hydrochlorothiazide, Lantus, losartan, pravastatin, warfarin.   SOCIAL HISTORY: The patient denies any alcohol, tobacco or illicit drug use.   FAMILY HISTORY: There is no known family history of GI malignancy, colon polyps or IBD.   REVIEW OF SYSTEMS: Ten-system review of systems was obtained of the patient. Pertinent positives are mentioned above and otherwise negative.   OBJECTIVE: VITAL SIGNS: Blood pressure 171/81, heart rate 62, respirations 18, temperature 97.9, bedside pulse ox 95%.  GENERAL: This is a pleasant 74 year old African American female resting quietly and comfortably in the exam room, in no acute distress, alert and oriented x 3.  HEAD: Atraumatic, normocephalic.  NECK: Supple. No lymphadenopathy noted.  HEENT: Sclerae anicteric. Mucous membranes moist.  PULMONARY: Respirations are even and unlabored. Clear to auscultation in bilateral anterior lung fields.  CARDIAC: Regular rate and rhythm. S1, S2 noted.  ABDOMEN: Soft, nontender, nondistended. Normoactive bowel sounds noted in all 4 quadrants. No guarding or rebound. No masses, hernias or organomegaly appreciated.  PSYCHIATRIC: Appropriate mood and affect.  NEUROLOGIC: Cranial nerves II through XII are grossly intact. RECTAL: Blood in the vault, but no clear source of bleeding identified.   LABORATORY DATA: White blood cells 7.6, hemoglobin 13, hematocrit 35.7, platelets 244. Sodium 145, potassium 3.5, BUN 14, creatinine 0.67, glucose 89, bilirubin 0.8, alkaline  phosphatase 51, ALT 19, AST 20. Troponins were negative.  Lipase 196, INR 2.0 down from 3.0. MCV is 81, PT 22.6.   ASSESSMENT: 1.  Rectal bleeding.  2.  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  3.  Personal history of colon polyps.  4.  Internal hemorrhoids.   PLAN: I have discussed this patient's case in detail with Dr. Barnetta Chapel who is involved in the development of the patient's plan of care. Certainly the etiology of her rectal bleeding is unclear at this point and may be some sort of anal outlet bleeding versus her known internal hemorrhoids. Her hemoglobin has remained stable as have her vitals. Therefore, we do recommend continuing to keep a close monitor for signs of recurrent bleeding, although at the present moment she has not bled for about 24 hours. Her Coumadin is currently on hold, which we do agree with and she is currently on a clear liquid diet which we agree with as well. Certainly, we can consider her for a colonoscopy for further evaluation of the bleeding, as she is long past due for a repeat for history of polyps regardless. Certainly with new onset bleeding this would certainly warrant [place] completing this procedure. This can be performed when clinically feasible and we will need her INR 1.3 or less prior to proceeding with endoscopic intervention. Therefore, continue holding her anticoagulation. We will continue to monitor this patient throughout hospitalization and make further recommendations per clinical course.   Thank you so much for this consultation and for allowing [place] to participate in the patient's plan of care.  These services provided by Hardie Shackleton. Elexius Minar, PA-C, under collaborative agreement with Barnetta Chapel, M.D.   ____________________________ Hardie Shackleton. Katalyna Socarras, PA-C kme:ce D: 11/25/2013 15:58:26 ET T: 11/25/2013 17:49:19 ET JOB#: [id number]  cc: Hardie Shackleton. Keala Drum, PA-C, <Dictator> Hardie Shackleton Nicolette Gieske PA ELECTRONICALLY SIGNED 11/26/2013 10:58

## 2014-11-27 NOTE — Consult Note (Signed)
Brief Consult Note: Diagnosis: rectal bleeding.   Patient was seen by consultant.   Discussed with Attending MD.   Comments: Patient seen and examined. Hgb stable, has not bled since last night. continue to monitor. Hold anti-coagulation. Likely can benefit from flex sig Vs. colonoscopy when clinically feasible. INR will need to be 1.3 or less. CL diet for now if tolerable.   full consult being dictated.  Electronic Signatures: Brantley StageEarle, Tiari Andringa M (PA-C)  (Signed 22-Apr-15 15:11)  Authored: Brief Consult Note   Last Updated: 22-Apr-15 15:11 by Ashok CordiaEarle, Etha Stambaugh M (PA-C)

## 2014-11-27 NOTE — Discharge Summary (Signed)
PATIENT NAME:  Hannah Robertson, Hannah Robertson MR#:  [id number] DATE OF BIRTH:  1941/02/15  DATE OF ADMISSION:  11/24/2013 DATE OF DISCHARGE:  11/27/2013  ADMITTING DIAGNOSIS: Hematochezia.   DISCHARGE DIAGNOSES:  1. Hematochezia status post colonoscopy on 11/27/2013 by Dr. Marva Panda where two 3-mm polyps were found in the rectum and proximal transverse colon respectively, resected and retrieved. Sigmoid colon diverticulosis and internal hemorrhoids, nonbleeding, were also noted.  2. 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  3. Hypokalemia. 4. Malignant essential hypertension. 5. Diabetes mellitus.  6. Obesity.   DISCHARGE CONDITION: Stable.   DISCHARGE MEDICATIONS: The patient is to continue: 1. Hydrochlorothiazide 25 mg p.o. daily, losartan 50 mg twice daily.  2. Lantus insulin 32 units twice daily subcutaneously. 3. Pravastatin 40 mg p.o. at bedtime.   4. Warfarin 2.5 mg 3 tablets which would 7.5 mg once daily on Saturdays and Sundays and 5 mg on other days.  5. Clonidine 0.1 mg twice daily.  6. Amlodipine 10 mg orally daily.  7. Minoxidil 2.5 mg twice daily.  HOME OXYGEN: None.   DIET: 2 gram salt, low fat, low cholesterol, carbohydrate controlled diet. Mechanical soft.  ACTIVITY LIMITATIONS: As tolerated.   FOLLOWUP APPOINTMENT: With [facility] in 2 days after discharge, Dr. Marva Panda in 1 week after discharge.   CONSULTANTS:  1. Dr. Marva Panda. 2. Brantley Stage, PA-C, of Dr. Marva Panda.   RADIOLOGIC STUDIES: None.  HISTORY OF PRESENT ILLNESS: The patient is a 74 year old African American female with past medical history significant for history of DVT, PE, and who is on Coumadin therapy for life who presents to the hospital with complaints of rectal bleed. Please refer to Dr. Arlys John admission on 11/24/2013. On arrival to the hospital, the patient's temperature was 98. Pulse was 76, her respiration rate was 20, blood pressure 210/92, saturation was 97% on room air.   PHYSICAL EXAM: Remarkable for  rectal blood on rectal exam.    LABORATORY DATA: Done on admission, 11/24/2013, revealed low potassium of 3.4, otherwise BMP was unremarkable. The patient's lipase level was normal at 196. Liver enzymes were all within normal limits. Cardiac enzymes x 2 were normal. White blood cell count was 8.3, hemoglobin was 13.0. Platelet count 283,000. Coagulation panel showed a ProTime of 30.6, INR was 3.0. Urinalysis was remarkable for 12 white blood cells, 3 red blood cells, trace bacteria, 1 epithelial cell. EKG showed sinus rhythm at 60 beats per minute, first degree AV block, inferior infarct which was cited on or before May  2014  and nonspecific ST-T changes were noted.   The patient was admitted to the hospital for further evaluation. Her hemoglobin level was followed closely. She was also given 1 dose of vitamin K to reverse her anticoagulation for colonoscopy. She was evaluated by gastroenterologist who proceeded to colonoscopy on the 11/27/2013. Colonoscopy was done without complications, one 3-mm polyp in the rectum was noted, which was resected and retrieved, one 3-mm polyp in the proximal transverse colon was also noted, resected and retrieved. Diverticulosis of the sigmoid colon and nonbleeding internal hemorrhoids were noted. The patient was taken back to the room, given food, and she tolerated that diet well, with no rebleeding and she was recommended to restart her Coumadin and follow up with her primary care physician, [facility], to follow her coagulation pattern. She is to follow up with Dr. Marva Panda for further recommendations, including possibly EGD as outpatient. Regarding hypokalemia, the patient's potassium level was supplemented. Regarding blood pressure issues, the patient's blood pressure medications were advanced  to current levels and the patient's blood pressure improved. Regarding diabetes as well as obesity, the patient was advised to lose weight and continue diabetic diet and insulin  therapy. On the day of discharge, she felt satisfactory, did not complain of any significant discomfort. Her vitals were stable. Temperature of 97.5, pulse was 64, respiration rate was 18 to 20, blood pressure 108/67, saturation was 96% on room air at rest.   TIME SPENT: 40 minutes on this patient.    ____________________________ Katharina Caperima Zeriyah Wain, MD rv:lt D: 11/29/2013 13:00:39 ET T: 11/30/2013 03:48:59 ET JOB#: [id number]  cc: Katharina Caperima Janki Dike, MD, <Dictator> [facility]   Kenleigh Toback MD ELECTRONICALLY SIGNED 12/05/2013 19:18

## 2014-11-27 NOTE — Consult Note (Signed)
Chief Complaint:  Subjective/Chief Complaint Please see full GI consult and brief consult note.  Patietn seen and examined, chart reviewed.  Patient with hematochezia beginning yesterday.  States bright red to dark.   No abdominal pain.  Personal h/o colon polyps and over due on routine scope.   DDx for this episode  diverticular, hemorrhoids, etc, likely the latter. will recommend flex sig versus colonoscopy for friday once INR less thatn 1.4. Following.   VITAL SIGNS/ANCILLARY NOTES: **Vital Signs.:   22-Apr-15 14:42  Vital Signs Type Routine  Temperature Temperature (F) 98.4  Celsius 36.8  Pulse Pulse 59  Respirations Respirations 18  Systolic BP Systolic BP 982  Diastolic BP (mmHg) Diastolic BP (mmHg) 80  Mean BP 104  Pulse Ox % Pulse Ox % 96  Pulse Ox Activity Level  At rest  Oxygen Delivery Room Air/ 21 %  *Intake and Output.:   22-Apr-15 10:08  Stool  pt had a large soft stool    14:10  Stool  pt had stool in commode,  bloody water   Brief Assessment:  GEN obese   Respiratory normal resp effort  clear BS   Gastrointestinal details normal Soft  Nontender  Nondistended  Bowel sounds normal   Lab Results: Hepatic:  22-Apr-15 02:00   Bilirubin, Total 0.8  Alkaline Phosphatase 51 (45-117 NOTE: New Reference Range 06/26/13)  SGPT (ALT) 19  SGOT (AST) 20  Total Protein, Serum  6.1  Albumin, Serum  3.1  Routine Chem:  22-Apr-15 02:00   Glucose, Serum 89  BUN 14  Creatinine (comp) 0.67  Sodium, Serum 145  Potassium, Serum 3.5  Chloride, Serum  114  CO2, Serum 27  Calcium (Total), Serum  8.3  Osmolality (calc) 289  eGFR (African American) >60  eGFR (Non-African American) >60 (eGFR values <35m/min/1.73 m2 may be an indication of chronic kidney disease (CKD). Calculated eGFR is useful in patients with stable renal function. The eGFR calculation will not be reliable in acutely ill patients when serum creatinine is changing rapidly. It is not useful in  patients  on dialysis. The eGFR calculation may not be applicable to patients at the low and high extremes of body sizes, pregnant women, and vegetarians.)  Anion Gap  4  Cardiac:  22-Apr-15 02:00   Troponin I < 0.02 (0.00-0.05 0.05 ng/mL or less: NEGATIVE  Repeat testing in 3-6 hrs  if clinically indicated. >0.05 ng/mL: POTENTIAL  MYOCARDIAL INJURY. Repeat  testing in 3-6 hrs if  clinically indicated. NOTE: An increase or decrease  of 30% or more on serial  testing suggests a  clinically important change)  Routine Coag:  21-Apr-15 17:45   INR 3.0 (INR reference interval applies to patients on anticoagulant therapy. A single INR therapeutic range for coumarins is not optimal for all indications; however, the suggested range for most indications is 2.0 - 3.0. Exceptions to the INR Reference Range may include: Prosthetic heart valves, acute myocardial infarction, prevention of myocardial infarction, and combinations of aspirin and anticoagulant. The need for a higher or lower target INR must be assessed individually. Reference: The Pharmacology and Management of the Vitamin K  antagonists: the seventh ACCP Conference on Antithrombotic and Thrombolytic Therapy. CMEBRA.3094Sept:126 (3suppl): [id number] A HCT value >55% may artifactually increase the PT.  In one study,  the increase was an average of 25%. Reference:  "Effect on Routine and Special Coagulation Testing Values of Citrate Anticoagulant Adjustment in Patients with High HCT Values." American Journal of Clinical Pathology 2006;126:400-405.)  22-Apr-15 02:00   Prothrombin  22.6  INR 2.0 (INR reference interval applies to patients on anticoagulant therapy. A single INR therapeutic range for coumarins is not optimal for all indications; however, the suggested range for most indications is 2.0 - 3.0. Exceptions to the INR Reference Range may include: Prosthetic heart valves, acute myocardial infarction, prevention of  myocardial infarction, and combinations of aspirin and anticoagulant. The need for a higher or lower target INR must be assessed individually. Reference: The Pharmacology and Management of the Vitamin K  antagonists: the seventh ACCP Conference on Antithrombotic and Thrombolytic Therapy. TGAID.0228 Sept:126 (3suppl): [id number]. A HCT value >55% may artifactually increase the PT.  In one study,  the increase was an average of 25%. Reference:  "Effect on Routine and Special Coagulation Testing Values of Citrate Anticoagulant Adjustment in Patients with High HCT Values." American Journal of Clinical Pathology 2006;126:400-405.)  Routine Hem:  21-Apr-15 17:45   Hemoglobin (CBC) 13.0  Platelet Count (CBC) 283 (Result(s) reported on 24 Nov 2013 at 06:30PM.)  22-Apr-15 02:00   Hemoglobin (CBC)  11.6  WBC (CBC) 7.6  RBC (CBC) 4.43  Hematocrit (CBC) 35.7  Platelet Count (CBC) 244  MCV 81  MCH 26.2  MCHC 32.5  RDW  14.7  Neutrophil % 43.4  Lymphocyte % 46.8  Monocyte % 6.3  Eosinophil % 2.1  Basophil % 1.4  Neutrophil # 3.3  Lymphocyte # 3.6  Monocyte # 0.5  Eosinophil # 0.2  Basophil # 0.1 (Result(s) reported on 25 Nov 2013 at 02:32AM.)    10:10   Hemoglobin (CBC) 13.0 (Result(s) reported on 25 Nov 2013 at 10:28AM.)   Assessment/Plan:  Assessment/Plan:  Assessment as noted   Electronic Signatures: Loistine Simas (MD)  (Signed 22-Apr-15 21:04)  Authored: Chief Complaint, VITAL SIGNS/ANCILLARY NOTES, Brief Assessment, Lab Results, Assessment/Plan   Last Updated: 22-Apr-15 21:04 by Loistine Simas (MD)

## 2014-11-27 NOTE — Consult Note (Signed)
Chief Complaint:  Subjective/Chief Complaint seen for hematochezia.  no further rectal bleeding, tolerated prep for colonoscopy. no n/v or abdominal pain.   VITAL SIGNS/ANCILLARY NOTES: **Vital Signs.:   24-Apr-15 13:29  Vital Signs Type Pre-Procedure  Celsius 37  Temperature Source oral  Pulse Pulse 64  Respirations Respirations 18  Systolic BP Systolic BP 148  Diastolic BP (mmHg) Diastolic BP (mmHg) 71  Mean BP 96  Pulse Ox % Pulse Ox % 97  Pulse Ox Activity Level  At rest  Oxygen Delivery Room Air/ 21 %   Brief Assessment:  Cardiac Regular   Respiratory clear BS   Gastrointestinal details normal Soft  Nontender  Nondistended  Bowel sounds normal   Lab Results: Routine Micro:  23-Apr-15 09:37   Micro Text Report CLOSTRIDIUM DIFFICILE   C.DIFFICILE ANTIGEN       C.DIFFICILE GDH ANTIGEN : NEGATIVE   C.DIFFICILE TOXIN A/B     C.DIFFICILE TOXINS A AND B : NEGATIVE   INTERPRETATION            Negative for C. difficile.    ANTIBIOTIC                        Micro Text Report STOOL COMPREHENSIVE   COMMENT                   NO SALMONELLA OR SHIGELLA ISOLATED   COMMENT                   NO PATHOGENIC E.COLI DETECTED   COMMENT                   NO CAMPYLOBACTER ANTIGEN DETECTED   ANTIBIOTIC                        Culture Comment NO SALMONELLA OR SHIGELLA ISOLATED  Culture Comment . NO PATHOGENIC E.COLI DETECTED  Culture Comment    . NO CAMPYLOBACTER ANTIGEN DETECTED  Result(s) reported on 27 Nov 2013 at 11:08AM.  Routine Coag:  23-Apr-15 04:24   INR 1.2 (INR reference interval applies to patients on anticoagulant therapy. A single INR therapeutic range for coumarins is not optimal for all indications; however, the suggested range for most indications is 2.0 - 3.0. Exceptions to the INR Reference Range may include: Prosthetic heart valves, acute myocardial infarction, prevention of myocardial infarction, and combinations of aspirin and anticoagulant. The need for a  higher or lower target INR must be assessed individually. Reference: The Pharmacology and Management of the Vitamin K  antagonists: the seventh ACCP Conference on Antithrombotic and Thrombolytic Therapy. Chest.2004 Sept:126 (3suppl): L78706342045-2335. A HCT value >55% may artifactually increase the PT.  In one study,  the increase was an average of 25%. Reference:  "Effect on Routine and Special Coagulation Testing Values of Citrate Anticoagulant Adjustment in Patients with High HCT Values." American Journal of Clinical Pathology 2006;126:400-405.)  Routine Hem:  24-Apr-15 13:36   Hemoglobin (CBC) 13.0 (Result(s) reported on 27 Nov 2013 at 01:49PM.)   Assessment/Plan:  Assessment/Plan:  Assessment 1) hematochezia.  resolved.  tolerated prep for colonoscopy. history of colon polyps. 2) h/o anticoagulation for DVT   Plan 1) colonoscopy today, further recs to follow.   Electronic Signatures: Barnetta ChapelSkulskie, Sabriya Yono (MD)  (Signed 24-Apr-15 14:41)  Authored: Chief Complaint, VITAL SIGNS/ANCILLARY NOTES, Brief Assessment, Lab Results, Assessment/Plan   Last Updated: 24-Apr-15 14:41 by Barnetta ChapelSkulskie, Rithik Odea (MD)

## 2016-02-28 ENCOUNTER — Emergency Department: Payer: Medicare HMO

## 2016-02-28 ENCOUNTER — Encounter: Payer: Self-pay | Admitting: Emergency Medicine

## 2016-02-28 ENCOUNTER — Emergency Department
Admission: EM | Admit: 2016-02-28 | Discharge: 2016-02-28 | Disposition: A | Discharge status: Home / Self Care | Payer: Medicare HMO | Attending: Emergency Medicine | Admitting: Emergency Medicine

## 2016-02-28 DIAGNOSIS — E119 Type 2 diabetes mellitus without complications: Secondary | ICD-10-CM | POA: Diagnosis not present

## 2016-02-28 DIAGNOSIS — I10 Essential (primary) hypertension: Secondary | ICD-10-CM | POA: Diagnosis not present

## 2016-02-28 DIAGNOSIS — E785 Hyperlipidemia, unspecified: Secondary | ICD-10-CM | POA: Diagnosis not present

## 2016-02-28 DIAGNOSIS — M25562 Pain in left knee: Secondary | ICD-10-CM

## 2016-02-28 DIAGNOSIS — Z79899 Other long term (current) drug therapy: Secondary | ICD-10-CM | POA: Insufficient documentation

## 2016-02-28 DIAGNOSIS — Z7901 Long term (current) use of anticoagulants: Secondary | ICD-10-CM | POA: Insufficient documentation

## 2016-02-28 MED ORDER — HYDROCODONE-ACETAMINOPHEN 5-325 MG PO TABS: ORAL_TABLET | ORAL | 0 refills | Status: DC

## 2016-02-28 NOTE — Discharge Instructions (Signed)
Did not take extra Tylenol with this medication. You may take Advil if needed with this medication. Keep your appointment with the orthopedist in [place]. Continue to use her walker and cane when walking inside the house and outside

## 2016-02-28 NOTE — ED Notes (Signed)
States she developed left knee pain yesterday w/o injury  States pain is to entire knee and moving into lower leg

## 2016-02-28 NOTE — ED Triage Notes (Signed)
Pt c/o left knee pain for the past 2 days, denies injury °

## 2016-02-28 NOTE — ED Provider Notes (Signed)
[facility] Emergency Department Provider Note   ____________________________________________  Time seen: Approximately 8:01 AM  I have reviewed the triage vital signs and the nursing notes.   HISTORY  Chief Complaint Knee Pain   HPI Hannah Robertson is a 75 y.o. female is here with complaint of left knee pain for the last 2 days. Patient denies any history of injury. Patient has taken in minimal amount of over-the-counter medication for pain without any relief. Patient states that she has increased pain with walking and that sitting relieves some of the pain. She denies any previous problems with her knee before now. Currently she rates her pain as a 9/10.   Past Medical History:  Diagnosis Date  . Diabetes mellitus without complication (HCC)   . GERD (gastroesophageal reflux disease)   . H/O blood clots 2014  . Hyperlipidemia   . Hypertension     Patient Active Problem List   Diagnosis Date Noted  . Right flank pain 07/09/2014    Past Surgical History:  Procedure Laterality Date  . ABDOMINAL HYSTERECTOMY    . CESAREAN SECTION    . CHOLECYSTECTOMY    . COLONOSCOPY  11/27/13  . ivc filter  2014  . SHOULDER SURGERY    . TONSILLECTOMY      Current Outpatient Rx  . Order #: [id number] Class: Historical Med  . Order #: [id number] Class: Historical Med  . Order #: [id number] Class: Historical Med  . Order #: [id number] Class: Print  . Order #: [id number] Class: Historical Med  . Order #: [id number] Class: Historical Med  . Order #: [id number] Class: Historical Med  . Order #: [id number] Class: Historical Med    Allergies Review of patient's allergies indicates no known allergies.  No family history on file.  Social History Social History  Substance Use Topics  . Smoking status: Never Smoker  . Smokeless tobacco: Not on file  . Alcohol use No    Review of Systems Constitutional: No fever/chills Cardiovascular: Denies chest pain. Respiratory:  Denies shortness of breath. Gastrointestinal:   No nausea, no vomiting.   Musculoskeletal: Positive for right knee pain. Skin: Negative for rash. Neurological: Negative for headaches, focal weakness or numbness.  10-point ROS otherwise negative.  ____________________________________________   PHYSICAL EXAM:  VITAL SIGNS: ED Triage Vitals [02/28/16 0748]  Enc Vitals Group     BP (!) 158/67     Pulse Rate 72     Resp 18     Temp 98.2 F (36.8 C)     Temp Source Oral     SpO2 97 %     Weight 260 lb (117.9 kg)     Height  (1.549 m)     Head Circumference      Peak Flow      Pain Score 9     Pain Loc      Pain Edu?      Excl. in GC?     Constitutional: Alert and oriented. Well appearing and in no acute distress. Eyes: Conjunctivae are normal. PERRL. EOMI. Head: Atraumatic. Nose: No congestion/rhinnorhea. Neck: No stridor.   Cardiovascular: Normal rate, regular rhythm. Grossly normal heart sounds.  Good peripheral circulation. Respiratory: Normal respiratory effort.  No retractions. Lungs CTAB. Musculoskeletal:Left knee has degenerative appearance. Range of motion is with minimal restriction. There is crepitus noted with range of motion. There is no effusion present. No erythema or abrasions were noted. Neurologic:  Normal speech and language. No gross focal neurologic deficits are appreciated. Skin:  Skin is  warm, dry and intact. No rash noted. Psychiatric: Mood and affect are normal. Speech and behavior are normal.  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 Mild chondrocalcinosis. I, Tommi Rumps, personally viewed and evaluated these images (plain radiographs) as part of my medical decision making, as well as reviewing the written report by the  radiologist. ____________________________________________   PROCEDURES  Procedure(s) performed: None  Procedures  Critical Care performed: No  ____________________________________________   INITIAL IMPRESSION / ASSESSMENT AND PLAN / ED COURSE  Pertinent labs & imaging results that were available during my care of the patient were reviewed by me and considered in my medical decision making (see chart for details).    Clinical Course   Patient was given Norco while in the emergency room and stated that there was some pain relief with this. Patient is to follow-up with Dr. Joice Lofts if any continued problems with her knee. She is also encouraged to continue using her cane or walker as needed for extra support when her knee is hurting.  ____________________________________________   FINAL CLINICAL IMPRESSION(S) / ED DIAGNOSES  Final diagnoses:  Knee pain, acute, left      NEW MEDICATIONS STARTED DURING THIS VISIT:  Discharge Medication List as of 02/28/2016  9:32 AM    START taking these medications   Details  HYDROcodone-acetaminophen (NORCO/VICODIN) 5-325 MG tablet 1 tablet every 4-6 hours as needed for pain., Print         Note:  This document was prepared using Dragon voice recognition software and may include unintentional dictation errors.    Tommi Rumps, PA-C 02/28/16 1615    Nita Sickle, MD 02/28/16 2214

## 2018-06-13 ENCOUNTER — Encounter: Payer: Self-pay | Admitting: Emergency Medicine

## 2018-06-13 ENCOUNTER — Other Ambulatory Visit: Payer: Self-pay

## 2018-06-13 ENCOUNTER — Emergency Department
Admission: EM | Admit: 2018-06-13 | Discharge: 2018-06-13 | Disposition: A | Discharge status: Home / Self Care | Payer: Medicare HMO | Attending: Emergency Medicine | Admitting: Emergency Medicine

## 2018-06-13 DIAGNOSIS — Z794 Long term (current) use of insulin: Secondary | ICD-10-CM | POA: Diagnosis not present

## 2018-06-13 DIAGNOSIS — I10 Essential (primary) hypertension: Secondary | ICD-10-CM | POA: Diagnosis not present

## 2018-06-13 DIAGNOSIS — Z79899 Other long term (current) drug therapy: Secondary | ICD-10-CM | POA: Diagnosis not present

## 2018-06-13 DIAGNOSIS — Z9049 Acquired absence of other specified parts of digestive tract: Secondary | ICD-10-CM | POA: Insufficient documentation

## 2018-06-13 DIAGNOSIS — R21 Rash and other nonspecific skin eruption: Secondary | ICD-10-CM | POA: Diagnosis present

## 2018-06-13 DIAGNOSIS — E119 Type 2 diabetes mellitus without complications: Secondary | ICD-10-CM | POA: Diagnosis not present

## 2018-06-13 DIAGNOSIS — Z7901 Long term (current) use of anticoagulants: Secondary | ICD-10-CM | POA: Insufficient documentation

## 2018-06-13 DIAGNOSIS — L299 Pruritus, unspecified: Secondary | ICD-10-CM

## 2018-06-13 MED ORDER — HYDROCORTISONE VALERATE 0.2 % EX OINT: TOPICAL_OINTMENT | CUTANEOUS | 1 refills | Status: DC

## 2018-06-13 MED ORDER — HYDROXYZINE HCL 25 MG PO TABS
25.0000 mg | ORAL_TABLET | Freq: Once | ORAL | Status: AC
Start: 2018-06-13 — End: 2018-06-13
  Administered 2018-06-13: 25 mg via ORAL
  Filled 2018-06-13: qty 1

## 2018-06-13 MED ORDER — HYDROXYZINE HCL 10 MG/5ML PO SYRP: 10.0000 mg | ORAL_SOLUTION | Freq: Three times a day (TID) | ORAL | 0 refills | Status: DC | PRN

## 2018-06-13 NOTE — ED Provider Notes (Signed)
[facility] Emergency Department Provider Note   ____________________________________________   First MD Initiated Contact with Patient 06/13/18 0802     (approximate)  I have reviewed the triage vital signs and the nursing notes.   HISTORY  Chief Complaint Pruritis    HPI Hannah Robertson is a 77 y.o. female patient complain a rash left knee.  Patient rash is been present for a few days.  Patient stated rash is itching is interfering her sleeping.  Patient states similar complaint approximately 2 months ago to the right side.  Patient state complaint resolved without medical interventions.  Patient denies recent insect bite or new personal hygiene products.  No pain with complaint.  Past Medical History:  Diagnosis Date  . Diabetes mellitus without complication (HCC)   . GERD (gastroesophageal reflux disease)   . H/O blood clots 2014  . Hyperlipidemia   . Hypertension     Patient Active Problem List   Diagnosis Date Noted  . Right flank pain 07/09/2014    Past Surgical History:  Procedure Laterality Date  . ABDOMINAL HYSTERECTOMY    . CESAREAN SECTION    . CHOLECYSTECTOMY    . COLONOSCOPY  11/27/13  . ivc filter  2014  . SHOULDER SURGERY    . TONSILLECTOMY      Prior to Admission medications   Medication Sig Start Date End Date Taking? Authorizing Provider  acetaminophen (TYLENOL) 325 MG tablet Take 325 mg by mouth every 6 (six) hours as needed.    [provider]  amLODipine (NORVASC) 10 MG tablet Take 10 mg by mouth daily.  04/07/14   [provider]  fluticasone (FLONASE) 50 MCG/ACT nasal spray Place 2 sprays into both nostrils daily.  04/07/14   [provider]  HYDROcodone-acetaminophen (NORCO/VICODIN) 5-325 MG tablet 1 tablet every 4-6 hours as needed for pain. 02/28/16   Tommi Rumps, PA-C  hydrocortisone valerate ointment (WESTCORT) 0.2 % Apply to affected area daily 06/13/18 06/13/19  Joni Reining, PA-C   hydrOXYzine (ATARAX) 10 MG/5ML syrup Take 5 mLs (10 mg total) by mouth 3 (three) times daily as needed for itching. 06/13/18   Joni Reining, PA-C  LANTUS SOLOSTAR 100 UNIT/ML Solostar Pen Inject 32 Units into the skin 2 (two) times daily.  04/20/14   [provider]  losartan-hydrochlorothiazide (HYZAAR) 50-12.5 MG per tablet Take 1 tablet by mouth daily.  07/05/14   [provider]  pravastatin (PRAVACHOL) 40 MG tablet Take 40 mg by mouth daily.  04/07/14   [provider]  warfarin (COUMADIN) 2.5 MG tablet  05/04/14   [provider]    Allergies Patient has no known allergies.  No family history on file.  Social History Social History   Tobacco Use  . Smoking status: Never Smoker  Substance Use Topics  . Alcohol use: No    Alcohol/week: 0.0 standard drinks  . Drug use: No    Review of Systems Constitutional: No fever/chills Eyes: No visual changes. ENT: No sore throat. Cardiovascular: Denies chest pain. Respiratory: Denies shortness of breath. Gastrointestinal: No abdominal pain.  No nausea, no vomiting.  No diarrhea.  No constipation. Genitourinary: Negative for dysuria. Musculoskeletal: Negative for back pain. Skin: Itching left lower extremity. Neurological: Negative for headaches, focal weakness or numbness. Endocrine:Diabetes, hyperlipidemia, and hypertension.   ____________________________________________   PHYSICAL EXAM:  VITAL SIGNS: ED Triage Vitals  Enc Vitals Group     BP 06/13/18 0750 (!) 143/108  Pulse Rate 06/13/18 0750 85     Resp 06/13/18 0750 16     Temp 06/13/18 0750 98.2 F (36.8 C)     Temp src --      SpO2 06/13/18 0750 100 %     Weight 06/13/18 0749 268 lb (121.6 kg)     Height 06/13/18 0749 5\' 1"  (1.549 m)     Head Circumference --      Peak Flow --      Pain Score 06/13/18 0749 0     Pain Loc --      Pain Edu? --      Excl. in GC? --    Constitutional: Alert and oriented. Well appearing and  in no acute distress. Cardiovascular: Normal rate, regular rhythm. Grossly normal heart sounds.  Good peripheral circulation.  Elevated diastolic blood pressure Respiratory: Normal respiratory effort.  No retractions. Lungs CTAB. Gastrointestinal: Soft and nontender. No distention. No abdominal bruits. No CVA tenderness. Musculoskeletal: No lower extremity tenderness nor edema.  No joint effusions. Neurologic:  Normal speech and language. No gross focal neurologic deficits are appreciated. No gait instability. Skin:  Skin is warm, dry and intact. No rash noted. Psychiatric: Mood and affect are normal. Speech and behavior are normal.  ____________________________________________   LABS (all labs ordered are listed, but only abnormal results are displayed)  Labs Reviewed - No data to display ____________________________________________  EKG   ____________________________________________  RADIOLOGY  ED MD interpretation:    Official radiology report(s): No results found.  ____________________________________________   PROCEDURES  Procedure(s) performed: None  Procedures  Critical Care performed: No  ____________________________________________   INITIAL IMPRESSION / ASSESSMENT AND PLAN / ED COURSE  As part of my medical decision making, I reviewed the following data within the electronic MEDICAL RECORD NUMBER    Patient presents with itching to the left knee.  Patient states rash with no visible lesions.  Patient given discharge care instruction and prescription for low-dose Atarax and hydrocortisone.  Patient advised follow-up PCP if condition persist.      ____________________________________________   FINAL CLINICAL IMPRESSION(S) / ED DIAGNOSES  Final diagnoses:  Pruritus     ED Discharge Orders         Ordered    hydrocortisone valerate ointment (WESTCORT) 0.2 %     06/13/18 0807    hydrOXYzine (ATARAX) 10 MG/5ML syrup  3 times daily PRN     06/13/18  1610           Note:  This document was prepared using Dragon voice recognition software and may include unintentional dictation errors.    Joni Reining, PA-C 06/13/18 9604    Jene Every, MD 06/13/18 1001

## 2018-06-13 NOTE — ED Triage Notes (Signed)
Pt to ED via POV c/o "rash" on her left knee. Pt states that it has been there for a few days. Pt reports that area itches. No rash noted in triage.

## 2018-06-13 NOTE — Discharge Instructions (Addendum)
Advised to follow-up with family doctor if no improvement in 2 to 3 days.

## 2018-07-16 ENCOUNTER — Ambulatory Visit
Admission: EM | Admit: 2018-07-16 | Discharge: 2018-07-16 | Disposition: A | Discharge status: Home / Self Care | Payer: Medicare HMO | Attending: Family Medicine | Admitting: Family Medicine

## 2018-07-16 ENCOUNTER — Other Ambulatory Visit: Payer: Self-pay

## 2018-07-16 ENCOUNTER — Encounter: Payer: Self-pay | Admitting: Emergency Medicine

## 2018-07-16 DIAGNOSIS — E119 Type 2 diabetes mellitus without complications: Secondary | ICD-10-CM

## 2018-07-16 DIAGNOSIS — R21 Rash and other nonspecific skin eruption: Secondary | ICD-10-CM | POA: Diagnosis not present

## 2018-07-16 MED ORDER — TRIAMCINOLONE ACETONIDE 0.5 % EX OINT: 1.0000 "application " | TOPICAL_OINTMENT | Freq: Two times a day (BID) | CUTANEOUS | 0 refills | Status: DC

## 2018-07-16 MED ORDER — CETIRIZINE HCL 5 MG PO TABS: 5.0000 mg | ORAL_TABLET | Freq: Every day | ORAL | 0 refills | Status: DC

## 2018-07-16 MED ORDER — FAMOTIDINE 20 MG PO TABS: 20.0000 mg | ORAL_TABLET | Freq: Two times a day (BID) | ORAL | 0 refills | Status: DC

## 2018-07-16 NOTE — ED Triage Notes (Addendum)
Patient in today c/o rash all over her body off & on x 1 month. Patient has seen [facility] ED and PCP for same. Patient has been prescribed Hydrocortisone 2% cream and Hydroxyzine 10mg /25ml for the itching and rash.

## 2018-07-16 NOTE — ED Provider Notes (Signed)
[facility]    CSN: [id number] Arrival date & time: 07/16/18  1112   History   Chief Complaint Chief Complaint  Patient presents with  . Rash   HPI  77 year old female presents with rash.  Patient has had an intermittent, raised, red rash over the past month.  She has been seen in the ER and been seen by her PCP.  She has been treated with hydroxyzine and hydrocortisone.  She has had some improvement but then worsens again.  No other medications or interventions tried.  She states that the rash is intensely pruritic.  Very bothersome.  Severe.  No known inciting factor.  No known exacerbating factors.  No other complaints.  Past Medical History:  Diagnosis Date  . Diabetes mellitus without complication (HCC)   . GERD (gastroesophageal reflux disease)   . H/O blood clots 2014  . Hyperlipidemia   . Hypertension     Patient Active Problem List   Diagnosis Date Noted  . Right flank pain 07/09/2014    Past Surgical History:  Procedure Laterality Date  . ABDOMINAL HYSTERECTOMY    . CESAREAN SECTION    . CHOLECYSTECTOMY    . COLONOSCOPY  11/27/13  . ivc filter  2014  . SHOULDER SURGERY    . TONSILLECTOMY      OB History    Gravida  4   Para  2   Term      Preterm      AB  2   Living  3     SAB      TAB  2   Ectopic      Multiple  1   Live Births           Obstetric Comments  1st Menstrual Cycle:  14 1st Pregnancy:  20          Home Medications    Prior to Admission medications   Medication Sig Start Date End Date Taking? Authorizing Provider  acetaminophen (TYLENOL) 325 MG tablet Take 325 mg by mouth every 6 (six) hours as needed.   Yes [provider]  amLODipine (NORVASC) 10 MG tablet Take 10 mg by mouth daily.  04/07/14  Yes [provider]  fluticasone (FLONASE) 50 MCG/ACT nasal spray Place 2 sprays into both nostrils daily.  04/07/14  Yes [provider]  LANTUS SOLOSTAR 100 UNIT/ML Solostar Pen  Inject 32 Units into the skin 2 (two) times daily.  04/20/14  Yes [provider]  losartan-hydrochlorothiazide (HYZAAR) 50-12.5 MG per tablet Take 1 tablet by mouth daily.  07/05/14  Yes [provider]  pravastatin (PRAVACHOL) 40 MG tablet Take 40 mg by mouth daily.  04/07/14  Yes [provider]  warfarin (COUMADIN) 2.5 MG tablet  05/04/14  Yes [provider]  cetirizine (ZYRTEC) 5 MG tablet Take 1 tablet (5 mg total) by mouth daily. 07/16/18   Tommie Samsook, Mikea Quadros G, DO  famotidine (PEPCID) 20 MG tablet Take 1 tablet (20 mg total) by mouth 2 (two) times daily. 07/16/18   Tommie Samsook, Veer Elamin G, DO  HYDROcodone-acetaminophen (NORCO/VICODIN) 5-325 MG tablet 1 tablet every 4-6 hours as needed for pain. 02/28/16   Tommi RumpsSummers, Rhonda L, PA-C  triamcinolone ointment (KENALOG) 0.5 % Apply 1 application topically 2 (two) times daily. 07/16/18   Tommie Samsook, Tabytha Gradillas G, DO    Family History Family History  Problem Relation Age of Onset  . Alzheimer's disease Mother   . Hypertension Mother   . Prostate cancer Father   .  Hypertension Father     Social History Social History   Tobacco Use  . Smoking status: Never Smoker  . Smokeless tobacco: Never Used  Substance Use Topics  . Alcohol use: No    Alcohol/week: 0.0 standard drinks  . Drug use: No     Allergies   Patient has no known allergies.   Review of Systems Review of Systems  Constitutional: Negative.   Skin: Positive for rash.   Physical Exam Triage Vital Signs ED Triage Vitals  Enc Vitals Group     BP 07/16/18 1125 (!) 180/75     Pulse Rate 07/16/18 1125 70     Resp 07/16/18 1125 16     Temp 07/16/18 1125 98.3 F (36.8 C)     Temp Source 07/16/18 1125 Oral     SpO2 07/16/18 1125 99 %     Weight 07/16/18 1127 265 lb (120.2 kg)     Height 07/16/18 1127 5\' 1"  (1.549 m)     Head Circumference --      Peak Flow --      Pain Score 07/16/18 1127 0     Pain Loc --      Pain Edu? --      Excl. in GC? --    Updated  Vital Signs BP (!) 180/75 (BP Location: Left Arm) Comment: patient has not taken HTN med today  Pulse 70   Temp 98.3 F (36.8 C) (Oral)   Resp 16   Ht 5\' 1"  (1.549 m)   Wt 120.2 kg   SpO2 99%   BMI 50.07 kg/m   Visual Acuity Right Eye Distance:   Left Eye Distance:   Bilateral Distance:    Right Eye Near:   Left Eye Near:    Bilateral Near:     Physical Exam  Constitutional: She is oriented to person, place, and time. She appears well-developed. No distress.  HENT:  Head: Normocephalic and atraumatic.  Pulmonary/Chest: Effort normal. No respiratory distress.  Neurological: She is alert and oriented to person, place, and time.  Skin:  Patient has a few raised, erythematous areas noted on the right anterior chest and left wrist.  Psychiatric: She has a normal mood and affect. Her behavior is normal.  Nursing note and vitals reviewed.  UC Treatments / Results  Labs (all labs ordered are listed, but only abnormal results are displayed) Labs Reviewed - No data to display  EKG None  Radiology No results found.  Procedures Procedures (including critical care time)  Medications Ordered in UC Medications - No data to display  Initial Impression / Assessment and Plan / UC Course  I have reviewed the triage vital signs and the nursing notes.  Pertinent labs & imaging results that were available during my care of the patient were reviewed by me and considered in my medical decision making (see chart for details).    77 year old female presents with rash.  Possible urticaria.  Placing on Zyrtec, Pepcid, topical triamcinolone.  Avoiding oral corticosteroids at this time due to underlying diabetes and localized nature.  Patient is to call her primary care physician about referral to dermatology.  Final Clinical Impressions(s) / UC Diagnoses   Final diagnoses:  Rash     Discharge Instructions     Medications as directed.  Call regarding your referral to  dermatology.  Take care  Dr. Adriana Simas    ED Prescriptions    Medication Sig Dispense Auth. Provider   cetirizine (ZYRTEC) 5 MG tablet Take  1 tablet (5 mg total) by mouth daily. 30 tablet Lillias Difrancesco G, DO   famotidine (PEPCID) 20 MG tablet Take 1 tablet (20 mg total) by mouth 2 (two) times daily. 60 tablet Zaryah Seckel G, DO   triamcinolone ointment (KENALOG) 0.5 % Apply 1 application topically 2 (two) times daily. 30 g Tommie Sams, DO     Controlled Substance Prescriptions [place] Controlled Substance Registry consulted? Not Applicable   Tommie Sams, DO 07/16/18 1220

## 2018-07-16 NOTE — Discharge Instructions (Signed)
Medications as directed.  Call regarding your referral to dermatology.  Take care  Dr. Adriana Simasook

## 2019-03-19 ENCOUNTER — Other Ambulatory Visit: Payer: Self-pay

## 2019-03-19 DIAGNOSIS — Z20822 Contact with and (suspected) exposure to covid-19: Secondary | ICD-10-CM

## 2019-03-21 LAB — SPECIMEN STATUS REPORT

## 2019-03-21 LAB — NOVEL CORONAVIRUS, NAA: SARS-CoV-2, NAA: NOT DETECTED

## 2019-07-30 ENCOUNTER — Encounter: Payer: Self-pay | Admitting: Urgent Care

## 2019-07-30 ENCOUNTER — Emergency Department: Payer: Medicare HMO

## 2019-07-30 ENCOUNTER — Other Ambulatory Visit: Payer: Self-pay

## 2019-07-30 ENCOUNTER — Ambulatory Visit (INDEPENDENT_AMBULATORY_CARE_PROVIDER_SITE_OTHER)
Admission: EM | Admit: 2019-07-30 | Discharge: 2019-07-30 | Disposition: A | Discharge status: Home / Self Care | Payer: Medicare HMO | Source: Home / Self Care

## 2019-07-30 ENCOUNTER — Inpatient Hospital Stay
Admission: EM | Admit: 2019-07-30 | Discharge: 2019-08-04 | DRG: 177 | Disposition: A | Discharge status: Home Health | Payer: Medicare HMO | Attending: Family Medicine | Admitting: Family Medicine

## 2019-07-30 ENCOUNTER — Encounter: Payer: Self-pay | Admitting: Emergency Medicine

## 2019-07-30 DIAGNOSIS — R410 Disorientation, unspecified: Secondary | ICD-10-CM | POA: Insufficient documentation

## 2019-07-30 DIAGNOSIS — E039 Hypothyroidism, unspecified: Secondary | ICD-10-CM | POA: Diagnosis present

## 2019-07-30 DIAGNOSIS — A084 Viral intestinal infection, unspecified: Secondary | ICD-10-CM

## 2019-07-30 DIAGNOSIS — E669 Obesity, unspecified: Secondary | ICD-10-CM | POA: Diagnosis present

## 2019-07-30 DIAGNOSIS — Z86711 Personal history of pulmonary embolism: Secondary | ICD-10-CM

## 2019-07-30 DIAGNOSIS — Z90710 Acquired absence of both cervix and uterus: Secondary | ICD-10-CM

## 2019-07-30 DIAGNOSIS — R5383 Other fatigue: Secondary | ICD-10-CM

## 2019-07-30 DIAGNOSIS — R509 Fever, unspecified: Secondary | ICD-10-CM

## 2019-07-30 DIAGNOSIS — J1282 Pneumonia due to coronavirus disease 2019: Secondary | ICD-10-CM | POA: Insufficient documentation

## 2019-07-30 DIAGNOSIS — E119 Type 2 diabetes mellitus without complications: Secondary | ICD-10-CM

## 2019-07-30 DIAGNOSIS — E860 Dehydration: Secondary | ICD-10-CM | POA: Diagnosis present

## 2019-07-30 DIAGNOSIS — Z79899 Other long term (current) drug therapy: Secondary | ICD-10-CM

## 2019-07-30 DIAGNOSIS — J1289 Other viral pneumonia: Secondary | ICD-10-CM | POA: Diagnosis present

## 2019-07-30 DIAGNOSIS — Z6841 Body Mass Index (BMI) 40.0 and over, adult: Secondary | ICD-10-CM

## 2019-07-30 DIAGNOSIS — Z79890 Hormone replacement therapy (postmenopausal): Secondary | ICD-10-CM

## 2019-07-30 DIAGNOSIS — I9589 Other hypotension: Secondary | ICD-10-CM | POA: Diagnosis present

## 2019-07-30 DIAGNOSIS — N179 Acute kidney failure, unspecified: Secondary | ICD-10-CM | POA: Diagnosis present

## 2019-07-30 DIAGNOSIS — U071 COVID-19: Principal | ICD-10-CM | POA: Diagnosis present

## 2019-07-30 DIAGNOSIS — I10 Essential (primary) hypertension: Secondary | ICD-10-CM | POA: Diagnosis present

## 2019-07-30 DIAGNOSIS — Z9049 Acquired absence of other specified parts of digestive tract: Secondary | ICD-10-CM

## 2019-07-30 DIAGNOSIS — K219 Gastro-esophageal reflux disease without esophagitis: Secondary | ICD-10-CM | POA: Diagnosis present

## 2019-07-30 DIAGNOSIS — Z8249 Family history of ischemic heart disease and other diseases of the circulatory system: Secondary | ICD-10-CM

## 2019-07-30 DIAGNOSIS — Z8042 Family history of malignant neoplasm of prostate: Secondary | ICD-10-CM

## 2019-07-30 DIAGNOSIS — R791 Abnormal coagulation profile: Secondary | ICD-10-CM | POA: Diagnosis present

## 2019-07-30 DIAGNOSIS — Z7901 Long term (current) use of anticoagulants: Secondary | ICD-10-CM

## 2019-07-30 DIAGNOSIS — Z888 Allergy status to other drugs, medicaments and biological substances status: Secondary | ICD-10-CM

## 2019-07-30 DIAGNOSIS — Z820 Family history of epilepsy and other diseases of the nervous system: Secondary | ICD-10-CM

## 2019-07-30 DIAGNOSIS — A0839 Other viral enteritis: Secondary | ICD-10-CM | POA: Diagnosis present

## 2019-07-30 DIAGNOSIS — E785 Hyperlipidemia, unspecified: Secondary | ICD-10-CM | POA: Diagnosis present

## 2019-07-30 DIAGNOSIS — E861 Hypovolemia: Secondary | ICD-10-CM | POA: Diagnosis present

## 2019-07-30 DIAGNOSIS — Z79891 Long term (current) use of opiate analgesic: Secondary | ICD-10-CM

## 2019-07-30 DIAGNOSIS — R0902 Hypoxemia: Secondary | ICD-10-CM | POA: Diagnosis present

## 2019-07-30 DIAGNOSIS — Z794 Long term (current) use of insulin: Secondary | ICD-10-CM

## 2019-07-30 HISTORY — DX: COVID-19: U07.1

## 2019-07-30 LAB — LIPASE, BLOOD: Lipase: 49 U/L (ref 11–51)

## 2019-07-30 LAB — CBC
HCT: 40.9 % (ref 36.0–46.0)
Hemoglobin: 12.6 g/dL (ref 12.0–15.0)
MCH: 26.1 pg (ref 26.0–34.0)
MCHC: 30.8 g/dL (ref 30.0–36.0)
MCV: 84.7 fL (ref 80.0–100.0)
Platelets: 235 10*3/uL (ref 150–400)
RBC: 4.83 MIL/uL (ref 3.87–5.11)
RDW: 15.1 % (ref 11.5–15.5)
WBC: 6.8 10*3/uL (ref 4.0–10.5)
nRBC: 0 % (ref 0.0–0.2)

## 2019-07-30 LAB — COMPREHENSIVE METABOLIC PANEL
ALT: 18 U/L (ref 0–44)
AST: 28 U/L (ref 15–41)
Albumin: 4.2 g/dL (ref 3.5–5.0)
Alkaline Phosphatase: 38 U/L (ref 38–126)
Anion gap: 13 (ref 5–15)
BUN: 23 mg/dL (ref 8–23)
CO2: 20 mmol/L — ABNORMAL LOW (ref 22–32)
Calcium: 9.3 mg/dL (ref 8.9–10.3)
Chloride: 103 mmol/L (ref 98–111)
Creatinine, Ser: 1.63 mg/dL — ABNORMAL HIGH (ref 0.44–1.00)
GFR calc Af Amer: 34 mL/min — ABNORMAL LOW (ref >60)
GFR calc non Af Amer: 30 mL/min — ABNORMAL LOW (ref >60)
Glucose, Bld: 183 mg/dL — ABNORMAL HIGH (ref 70–99)
Potassium: 3.3 mmol/L — ABNORMAL LOW (ref 3.5–5.1)
Sodium: 136 mmol/L (ref 135–145)
Total Bilirubin: 0.8 mg/dL (ref 0.3–1.2)
Total Protein: 7.6 g/dL (ref 6.5–8.1)

## 2019-07-30 LAB — SARS CORONAVIRUS 2 AG (30 MIN TAT): SARS Coronavirus 2 Ag: POSITIVE — AB

## 2019-07-30 MED ORDER — IBUPROFEN 600 MG PO TABS
600.0000 mg | ORAL_TABLET | Freq: Once | ORAL | Status: AC
  Administered 2019-07-30: 600 mg via ORAL
  Filled 2019-07-30: qty 1

## 2019-07-30 MED ORDER — SODIUM CHLORIDE 0.9% FLUSH: 3.0000 mL | Freq: Once | INTRAVENOUS | Status: DC

## 2019-07-30 MED ORDER — ACETAMINOPHEN 650 MG RE SUPP: 975.0000 mg | Freq: Once | RECTAL | Status: DC

## 2019-07-30 MED ORDER — ACETAMINOPHEN 500 MG PO TABS
1000.0000 mg | ORAL_TABLET | Freq: Once | ORAL | Status: AC
  Administered 2019-07-30: 17:00:00 1000 mg via ORAL
  Filled 2019-07-30: qty 2

## 2019-07-30 NOTE — ED Triage Notes (Signed)
Pt reports she started yesterday with feeling fatigue and tired. States her son who she lives with has Conger. Pt had a fever yesterday. No pain today.

## 2019-07-30 NOTE — ED Provider Notes (Signed)
[facility] Emergency Department Provider Note   ____________________________________________   First MD Initiated Contact with Patient 07/30/19 2311     (approximate)  I have reviewed the triage vital signs and the nursing notes.   HISTORY  Chief Complaint Emesis    HPI Hannah Robertson is a 78 y.o. female sent from med in urgent care to the ED for further evaluation of generalized weakness, cough, nausea/vomiting, dehydration, fever.  Patient became sick yesterday.  T-max 102 F.  Family was concerned that she was acting altered.  She did test positive for Covid antigen at the urgent care.  Patient complains of generalized malaise, cough, nausea/vomiting.  Denies chest pain, shortness of breath, diarrhea.       Past Medical History:  Diagnosis Date  . COVID-19 07/30/2019  . Diabetes mellitus without complication ([place])   . GERD (gastroesophageal reflux disease)   . H/O blood clots 2014  . Hyperlipidemia   . Hypertension     Patient Active Problem List   Diagnosis Date Noted  . AKI (acute kidney injury) ([place]) 07/31/2019  . Viral gastroenteritis 07/31/2019  . Dehydration 07/31/2019  . Insulin dependent type 2 diabetes mellitus ([place]) 07/31/2019  . Essential hypertension 07/31/2019  . Acquired hypothyroidism 07/31/2019  . Chronic anticoagulation 07/31/2019  . Hypotension due to hypovolemia 07/31/2019  . Pneumonia due to COVID-19 virus 07/30/2019  . Right flank pain 07/09/2014    Past Surgical History:  Procedure Laterality Date  . ABDOMINAL HYSTERECTOMY    . CESAREAN SECTION    . CHOLECYSTECTOMY    . COLONOSCOPY  11/27/13  . ivc filter  2014  . SHOULDER SURGERY    . TONSILLECTOMY      Prior to Admission medications   Medication Sig Start Date End Date Taking? Authorizing Provider  acetaminophen (TYLENOL) 325 MG tablet Take 325 mg by mouth every 6 (six) hours as needed.    [provider]  amLODipine (NORVASC) 10 MG tablet  Take 10 mg by mouth daily.  04/07/14   [provider]  cetirizine (ZYRTEC) 5 MG tablet Take 1 tablet (5 mg total) by mouth daily. 07/16/18   Coral Spikes, DO  famotidine (PEPCID) 20 MG tablet Take 1 tablet (20 mg total) by mouth 2 (two) times daily. 07/16/18   Coral Spikes, DO  fluticasone (FLONASE) 50 MCG/ACT nasal spray Place 2 sprays into both nostrils daily.  04/07/14   [provider]  HYDROcodone-acetaminophen (NORCO/VICODIN) 5-325 MG tablet 1 tablet every 4-6 hours as needed for pain. 02/28/16   Johnn Hai, PA-C  LANTUS SOLOSTAR 100 UNIT/ML Solostar Pen Inject 32 Units into the skin 2 (two) times daily.  04/20/14   [provider]  levothyroxine (SYNTHROID) 112 MCG tablet Take 112 mcg by mouth daily. 07/25/19   [provider]  losartan-hydrochlorothiazide (HYZAAR) 50-12.5 MG per tablet Take 1 tablet by mouth daily.  07/05/14   [provider]  pravastatin (PRAVACHOL) 40 MG tablet Take 40 mg by mouth daily.  04/07/14   [provider]  triamcinolone ointment (KENALOG) 0.5 % Apply 1 application topically 2 (two) times daily. 07/16/18   Coral Spikes, DO  warfarin (COUMADIN) 2.5 MG tablet  05/04/14   [provider]    Allergies Patient has no known allergies.  Family History  Problem Relation Age of Onset  . Alzheimer's disease Mother   . Hypertension Mother   . Prostate cancer Father   . Hypertension Father  Social History Social History   Tobacco Use  . Smoking status: Never Smoker  . Smokeless tobacco: Never Used  Substance Use Topics  . Alcohol use: No    Alcohol/week: 0.0 standard drinks  . Drug use: No    Review of Systems  Constitutional: Positive for fever/chills, malaise and generalized weakness Eyes: No visual changes. ENT: No sore throat. Cardiovascular: Denies chest pain. Respiratory: Denies shortness of breath. Gastrointestinal: No abdominal pain.  Positive for nausea and vomiting.  No  diarrhea.  No constipation. Genitourinary: Negative for dysuria. Musculoskeletal: Negative for back pain. Skin: Negative for rash. Neurological: Negative for headaches, focal weakness or numbness.   ____________________________________________   PHYSICAL EXAM:  VITAL SIGNS: ED Triage Vitals  Enc Vitals Group     BP 07/30/19 1700 138/66     Pulse Rate 07/30/19 1700 84     Resp 07/30/19 1700 16     Temp 07/30/19 1700 (!) 103.3 F (39.6 C)     Temp Source 07/30/19 1700 Oral     SpO2 07/30/19 1700 94 %     Weight 07/30/19 1659 240 lb 1.3 oz (108.9 kg)     Height 07/30/19 1659 5\' 2"  (1.575 m)     Head Circumference --      Peak Flow --      Pain Score 07/30/19 1658 0     Pain Loc --      Pain Edu? --      Excl. in GC? --     Constitutional: Alert and oriented.  Elderly appearing and in mild acute distress. Eyes: Conjunctivae are normal. PERRL. EOMI. Head: Atraumatic. Nose: No congestion/rhinnorhea. Mouth/Throat: Mucous membranes are mildly dry.   Neck: No stridor.  Supple neck without meningismus. Cardiovascular: Normal rate, regular rhythm. Grossly normal heart sounds.  Good peripheral circulation. Respiratory: Increased respiratory effort.  No retractions.  Gastrointestinal: Soft and nontender. No distention. No abdominal bruits. No CVA tenderness. Musculoskeletal: No lower extremity tenderness nor edema.  No joint effusions. Neurologic:  Normal speech and language. No gross focal neurologic deficits are appreciated.  Skin:  Skin is warm, dry and intact. No rash noted.  No petechiae. Psychiatric: Mood and affect are normal. Speech and behavior are normal.  ____________________________________________   LABS (all labs ordered are listed, but only abnormal results are displayed)  Labs Reviewed  COMPREHENSIVE METABOLIC PANEL - Abnormal; Notable for the following components:      Result Value   Potassium 3.3 (*)    CO2 20 (*)    Glucose, Bld 183 (*)    Creatinine,  Ser 1.63 (*)    GFR calc non Af Amer 30 (*)    GFR calc Af Amer 34 (*)    All other components within normal limits  URINALYSIS, COMPLETE (UACMP) WITH MICROSCOPIC - Abnormal; Notable for the following components:   Color, Urine AMBER (*)    APPearance HAZY (*)    Protein, ur 30 (*)    All other components within normal limits  FIBRIN DERIVATIVES D-DIMER ([facility] ONLY) - Abnormal; Notable for the following components:   Fibrin derivatives D-dimer ([facility]) 504.72 (*)    All other components within normal limits  FERRITIN - Abnormal; Notable for the following components:   Ferritin 600 (*)    All other components within normal limits  CREATININE, SERUM - Abnormal; Notable for the following components:   Creatinine, Ser 1.75 (*)    GFR calc non Af Amer 27 (*)    GFR calc Af 08/01/19  32 (*)    All other components within normal limits  PROTIME-INR - Abnormal; Notable for the following components:   Prothrombin Time 15.7 (*)    INR 1.3 (*)    All other components within normal limits  CULTURE, BLOOD (ROUTINE X 2)  CULTURE, BLOOD (ROUTINE X 2)  URINE CULTURE  LIPASE, BLOOD  CBC  LACTIC ACID, PLASMA  PROCALCITONIN  LACTATE DEHYDROGENASE  TRIGLYCERIDES  FIBRINOGEN  C-REACTIVE PROTEIN  ABO/RH  TROPONIN I (HIGH SENSITIVITY)   ____________________________________________  EKG  ED ECG REPORT I, Aimy Sweeting J, the attending physician, personally viewed and interpreted this ECG.   Date: 07/30/2019  EKG Time: 2354  Rate: 66  Rhythm: normal EKG, normal sinus rhythm  Axis: Normal  Intervals:none  ST&T Change: Nonspecific  ____________________________________________  RADIOLOGY  ED MD interpretation: Bibasilar atelectasis versus early infection  Official radiology report(s): DG Chest Port 1 View  Result Date: 07/30/2019 CLINICAL DATA:  Fatigue, COVID-19 exposure, fever 1 day prior EXAM: PORTABLE CHEST 1 VIEW COMPARISON:  Radiograph Dec 25, 2012, CT March 11, 2010 FINDINGS: Lung  volumes are low with some increasing streaky opacities in the lung bases possibly accentuated by body habitus. No pneumothorax or effusion. Mild cardiomegaly with a calcified tortuous aorta. No acute osseous or soft tissue abnormality. Degenerative changes are present in the imaged spine and shoulders. Stable surgical clip seen in the base of the left neck. IMPRESSION: 1. Low lung volumes with some increasing streaky opacities in the lung bases, possibly reflecting atelectasis or early infection 2. Mild cardiomegaly. 3.  Aortic Atherosclerosis (ICD10-I70.0). Electronically Signed   By: Kreg ShropshirePrice  DeHay M.D.   On: 07/30/2019 23:29    ____________________________________________   PROCEDURES  Procedure(s) performed (including Critical Care):  Procedures   ____________________________________________   INITIAL IMPRESSION / ASSESSMENT AND PLAN / ED COURSE  As part of my medical decision making, I reviewed the following data within the electronic MEDICAL RECORD NUMBER History obtained from family, Nursing notes reviewed and incorporated, Labs reviewed, EKG interpreted, Old chart reviewed, Radiograph reviewed, Discussed with admitting physician and Notes from prior ED visits     Hannah Robertson was evaluated in Emergency Department on 07/31/2019 for the symptoms described in the history of present illness. She was evaluated in the context of the global COVID-19 pandemic, which necessitated consideration that the patient might be at risk for infection with the SARS-CoV-2 virus that causes COVID-19. Institutional protocols and algorithms that pertain to the evaluation of patients at risk for COVID-19 are in a state of rapid change based on information released by regulatory bodies including the CDC and federal and state organizations. These policies and algorithms were followed during the patient's care in the ED.    78 year old female sent from urgent care for further evaluation/probable admission for Covid  related fever, weakness, cough, nausea and vomiting.  Differential diagnosis includes but is not limited to COVID-19, CAP, sepsis, UTI, etc.  Laboratory results notable for acute kidney injury secondary to dehydration.  Will obtain further COVID-19/sepsis work-up, chest x-ray, UA.  Patient just given ibuprofen; will recheck temperature.  Ambulate patient with pulse oximeter.  Anticipate hospitalization.  Clinical Course as of Jul 30 353  Fri Jul 31, 2019  0030 Patient required assistance to ambulate to the commode.  Was tachypneic when she returned back to the bed.  Room air sats 90 to 91%.  Placed on 2 L nasal cannula oxygen.   [JS]    Clinical Course User Index [JS] Irean HongSung, Mohsin Crum J,  MD     ____________________________________________   FINAL CLINICAL IMPRESSION(S) / ED DIAGNOSES  Final diagnoses:  Fever, unspecified fever cause  COVID-19  Dehydration  AKI (acute kidney injury) [facility])     ED Discharge Orders    None       Note:  This document was prepared using Dragon voice recognition software and may include unintentional dictation errors.   Irean Hong, MD 07/31/19 [phone]

## 2019-07-30 NOTE — ED Notes (Signed)
Patient states she tested positive for COVID today at [facility] urgent care. Patient states having some SOB, coughing that is productive with a clear phlegm. Patient states she has only been taking tylenol for her fever.

## 2019-07-30 NOTE — ED Provider Notes (Signed)
[name], [place]   Name: Hannah Robertson DOB: [birth year] MRN: [id number] CSN: [id number] PCP: [facility]  Arrival date and time:  07/30/19 1534  Chief Complaint:  Fever and Fatigue   NOTE: Prior to seeing the patient today, I have reviewed the triage nursing documentation and vital signs. Clinical staff has updated patient's PMH/PSHx, current medication list, and drug allergies/intolerances to ensure comprehensive history available to assist in medical decision making.   History:   HPI: Hannah Robertson is a 78 y.o. female who presents today accompanied by her daughter. Daughter concerned about patient being confused, "not herself", and having difficulty getting her words out since becoming acutely ill yesterday. Patient able to provide a good portion of her history today, however there are difficulties noted with her providing exactly what is going on, thus HPI is supplemented by her daughter. Family reporting that patient has had complaints of cough, congestion, fatigue, and generalized weakness. She has been febrile to a Tmax of 102; last took antipyretic (APAP) dose approximately 2 hours PTA. Cough has been productive of clear sputum. She woke this morning with nausea and diarrhea; denies vomiting and abdominal pain. She has had decreased oral intake since the onset of her symptoms. When asked about her last intake, patient seemed confused and very frustrated by the fact that she was unable to provide this information. Daughter states, "you see what we are talking about? She is confused. She cannot think straight and get her words out". Patient denies any perceived alterations to her sense of taste or smell. Of note, patient's son tested positive for SARS-CoV-2 (novel coronavirus) today. She lives in the same home with her son and has had direct close contact with him.  She has not been tested for SARS-CoV-2 in the past 14 days; last tested negative on 03/20/2019 per her available EMR  results. Patient has not been vaccinated for influenza this season that the daughter is aware of. In efforts to conservatively manage her symptoms at home, the patient notes that she has used APAP, which has minimally helped to improve her symptoms.    Past Medical History:  Diagnosis Date  . Diabetes mellitus without complication ([place])   . GERD (gastroesophageal reflux disease)   . H/O blood clots 2014  . Hyperlipidemia   . Hypertension     Past Surgical History:  Procedure Laterality Date  . ABDOMINAL HYSTERECTOMY    . CESAREAN SECTION    . CHOLECYSTECTOMY    . COLONOSCOPY  11/27/13  . ivc filter  2014  . SHOULDER SURGERY    . TONSILLECTOMY      Family History  Problem Relation Age of Onset  . Alzheimer's disease Mother   . Hypertension Mother   . Prostate cancer Father   . Hypertension Father     Social History   Tobacco Use  . Smoking status: Never Smoker  . Smokeless tobacco: Never Used  Substance Use Topics  . Alcohol use: No    Alcohol/week: 0.0 standard drinks  . Drug use: No    Patient Active Problem List   Diagnosis Date Noted  . Right flank pain 07/09/2014    Home Medications:    No outpatient medications have been marked as taking for the 07/30/19 encounter [facility] Encounter).    Allergies:   Patient has no known allergies.  Review of Systems (ROS): Review of Systems  Constitutional: Positive for activity change (decreased), appetite change (decreased nutritional intake; food and fluids), fatigue and fever (Tmax  102).  HENT: Positive for congestion. Negative for ear pain, postnasal drip, rhinorrhea, sinus pressure, sinus pain, sneezing and sore throat.   Eyes: Negative for pain, discharge and redness.  Respiratory: Positive for cough. Negative for chest tightness and shortness of breath.   Cardiovascular: Negative for chest pain and palpitations.  Gastrointestinal: Positive for diarrhea and nausea. Negative for abdominal pain and vomiting.    Musculoskeletal: Negative for arthralgias, back pain, myalgias and neck pain.  Skin: Negative for color change, pallor and rash.  Allergic/Immunologic: Positive for environmental allergies (seasonal).  Neurological: Positive for weakness (generalized). Negative for dizziness, syncope and headaches.  Hematological: Negative for adenopathy.  Psychiatric/Behavioral: Positive for confusion (see HPI).     Vital Signs: Today's Vitals   07/30/19 1548 07/30/19 1553 07/30/19 1626  BP: (!) 121/56    Pulse: 81    Resp: 20    Temp: (!) 101.5 F (38.6 C)    TempSrc: Oral    SpO2: 95%    Weight:  240 lb (108.9 kg)   PainSc:  0-No pain 0-No pain    Physical Exam: Physical Exam  Constitutional: She is well-developed, well-nourished, and in no distress.  Acutely ill appearing; fatigued/listless. Difficulty with expressing words/thoughts; "this is different for her" per daughter. Oriented to person, place, and time.   HENT:  Head: Normocephalic and atraumatic.  Nose: Mucosal edema (mild) present. No rhinorrhea or sinus tenderness.  Mouth/Throat: Uvula is midline, oropharynx is clear and moist and mucous membranes are normal.  Eyes: Pupils are equal, round, and reactive to light.  Cardiovascular: Normal rate, regular rhythm, normal heart sounds and intact distal pulses.  Pulmonary/Chest: Effort normal and breath sounds normal. No respiratory distress.  Mild cough in clinic. No increased WOB. Able to speak in complete sentences. SPO2 95% on RA.   Abdominal: Soft. Normal appearance and bowel sounds are normal. She exhibits no distension. There is no abdominal tenderness.  Neurological: She is alert. She has normal sensation. She displays weakness (generalized). Gait (more unsteady as compared to baseline per daughter; ambulates with assitance of standard cane) abnormal.  Skin: Skin is warm and dry. No rash noted. She is not diaphoretic.  Psychiatric: Mood, affect and judgment normal.  Nursing note  and vitals reviewed.   Urgent Care Treatments / Results:   Orders Placed This Encounter  Procedures  . SARS Coronavirus 2 Ag (30 min TAT) - Nasal Swab (BD Veritor Kit)  . Airborne and Contact precautions    LABS: PLEASE NOTE: all labs that were ordered this encounter are listed, however only abnormal results are displayed. Labs Reviewed  SARS CORONAVIRUS 2 AG (30 MIN TAT) - Abnormal; Notable for the following components:      Result Value   SARS Coronavirus 2 Ag POSITIVE (*)    All other components within normal limits    EKG: -None  RADIOLOGY: No results found.  PROCEDURES: Procedures  MEDICATIONS RECEIVED THIS VISIT: Medications - No data to display  PERTINENT CLINICAL COURSE NOTES/UPDATES:  Clinical Course  Thu Jul 30, 2019  1624 Patient report called to [facility]. Spoke with first nurse Jinny Blossom, RN). Advised of SARS-CoV-2 (+) status. Patient will be presenting POV driven by her daughter.    [BG]    Clinical Course User Index [BG] Karen Kitchens, NP   Initial Impression / Assessment and Plan / Urgent Care Course:  Pertinent labs & imaging results that were available during my care of the patient were personally reviewed by me and considered in my  medical decision making (see lab/imaging section of note for values and interpretations).  UMAIMA SCHOLTEN is a 78 y.o. female who presents to [facility] today with complaints of Fever and Fatigue  Patient acutely ill appearing in clinic today. She does not appear to be in any acute distress. Presenting symptoms (see HPI) and exam as documented above. She presents with symptoms associated with SARS-CoV-2 (novel coronavirus). She lives with her son who tested positive for the virus today. Discussed typical symptom constellation. Reviewed potential for infection and need for testing. Patient amenable to being tested. Rapid SARS-CoV-2 Ag swab collected by certified clinical staff, which resulted as POSITIVE. Patient with  multiple comorbid factors/conditions that increase her morbidity and mortality risk, those of which include age, obesity, T2DM, HTN, HLD, and history of VTE.   Patient febrile to a Tmax of 102. No tachycardia or significant hypoxia; HR 81 and SPO2 95% on RA. Patient is not eating and is "more confused" and having "difficulty getting words out" per her family. Discussed with patient and daughter that perceived confusion is multifactorial and related to acute viral illness, delirium from fever, and dehydration. Patient's presenting symptoms today are felt to require further evaluation and possible intervention in a setting that is capable of providing a higher level of care. Patient is going to need labs, imaging of her chest, IVFs/medications, and possible admission to the hospital based findings/response to the aforementioned. I discussed with patient that her problems today are outside of the capabilities of this outpatient urgent care setting, thus my recommendations are to have her seen in the emergency department at either [facility] or [facility]. After discussion, patient and her daughter decided that she would like to be seen at [facility]. Daughter feels comfortable transporting patient. She is clinically stable at this point, thus POV transport is deemed appropriate.   Patient report called to [facility] emergency department staff; spoke with Jinny Blossom, RN. Nurse was advised of patient's presenting complaints, assessment in clinic, findings from work up thus far, and plans for patient to present there for ongoing evaluation and management of her symptoms. Questions fielded. Nurse advised to return a call to [facility] with any questions or concerns pertaining to the care that [place] received here today. Hospital staff aware that patient will be presenting to their facility via POV today.    Final Clinical Impressions / Urgent Care Diagnoses:   Final diagnoses:  COVID-19  Fatigue, unspecified  type  Confusion  Fever, unspecified    New Prescriptions:  [place] Controlled Substance Registry consulted? Not Applicable  No orders of the defined types were placed in this encounter.   Recommended Follow up Care:   Follow-up Information    [facility] EMERGENCY DEPARTMENT.   Specialty: Emergency Medicine Why: GO TO THE ER FOR FURTHER EVALUATION AND TREATMENT. MAY REQUIRE ADMISSION BASED ON FINDINGS. Contact information: [place] [phone] [place] [phone]        NOTE: This note was prepared using Dragon dictation software along with smaller phrase technology. Despite my best ability to proofread, there is the potential that transcriptional errors may still occur from this process, and are completely unintentional.     Karen Kitchens, NP 07/30/19 [phone]

## 2019-07-30 NOTE — ED Triage Notes (Signed)
First RN Note: Pt presents to ED via POV from [place] with c/o fever, diarrhea, NP at [facility] reports confusion, Covid+ at [facility] today with decreased PO intake today. Pt with fever at [place] of 101.5, other VS stable.

## 2019-07-30 NOTE — ED Triage Notes (Signed)
Arrives to ED states she with c/o nausea x 1 day and fever and diarrhea.  Denies emesis.  Patient is AAOx3.  Skin warm and dry. NAD.  Tested COVID positive today at [facility].

## 2019-07-30 NOTE — Discharge Instructions (Addendum)
GO TO EMERGENCY ROOM FOR FURTHER EVALUATION.  Honor Loh, MSN, APRN, FNP-C, CEN Advanced Practice Provider [facility] 07/30/2019 4:22 PM

## 2019-07-31 DIAGNOSIS — U071 COVID-19: Principal | ICD-10-CM

## 2019-07-31 DIAGNOSIS — Z7901 Long term (current) use of anticoagulants: Secondary | ICD-10-CM

## 2019-07-31 DIAGNOSIS — N179 Acute kidney failure, unspecified: Secondary | ICD-10-CM | POA: Diagnosis not present

## 2019-07-31 DIAGNOSIS — Z79891 Long term (current) use of opiate analgesic: Secondary | ICD-10-CM | POA: Diagnosis not present

## 2019-07-31 DIAGNOSIS — E119 Type 2 diabetes mellitus without complications: Secondary | ICD-10-CM

## 2019-07-31 DIAGNOSIS — Z794 Long term (current) use of insulin: Secondary | ICD-10-CM | POA: Diagnosis not present

## 2019-07-31 DIAGNOSIS — Z90710 Acquired absence of both cervix and uterus: Secondary | ICD-10-CM | POA: Diagnosis not present

## 2019-07-31 DIAGNOSIS — I10 Essential (primary) hypertension: Secondary | ICD-10-CM | POA: Diagnosis not present

## 2019-07-31 DIAGNOSIS — Z888 Allergy status to other drugs, medicaments and biological substances status: Secondary | ICD-10-CM | POA: Diagnosis not present

## 2019-07-31 DIAGNOSIS — E039 Hypothyroidism, unspecified: Secondary | ICD-10-CM | POA: Diagnosis present

## 2019-07-31 DIAGNOSIS — A084 Viral intestinal infection, unspecified: Secondary | ICD-10-CM | POA: Diagnosis not present

## 2019-07-31 DIAGNOSIS — Z86711 Personal history of pulmonary embolism: Secondary | ICD-10-CM | POA: Diagnosis not present

## 2019-07-31 DIAGNOSIS — I9589 Other hypotension: Secondary | ICD-10-CM | POA: Diagnosis present

## 2019-07-31 DIAGNOSIS — Z9049 Acquired absence of other specified parts of digestive tract: Secondary | ICD-10-CM | POA: Diagnosis not present

## 2019-07-31 DIAGNOSIS — K219 Gastro-esophageal reflux disease without esophagitis: Secondary | ICD-10-CM | POA: Diagnosis present

## 2019-07-31 DIAGNOSIS — A0839 Other viral enteritis: Secondary | ICD-10-CM | POA: Diagnosis present

## 2019-07-31 DIAGNOSIS — E860 Dehydration: Secondary | ICD-10-CM | POA: Diagnosis present

## 2019-07-31 DIAGNOSIS — J1289 Other viral pneumonia: Secondary | ICD-10-CM | POA: Diagnosis present

## 2019-07-31 DIAGNOSIS — E785 Hyperlipidemia, unspecified: Secondary | ICD-10-CM | POA: Diagnosis present

## 2019-07-31 DIAGNOSIS — E861 Hypovolemia: Secondary | ICD-10-CM | POA: Diagnosis present

## 2019-07-31 DIAGNOSIS — Z8249 Family history of ischemic heart disease and other diseases of the circulatory system: Secondary | ICD-10-CM | POA: Diagnosis not present

## 2019-07-31 DIAGNOSIS — Z6841 Body Mass Index (BMI) 40.0 and over, adult: Secondary | ICD-10-CM | POA: Diagnosis not present

## 2019-07-31 DIAGNOSIS — Z8042 Family history of malignant neoplasm of prostate: Secondary | ICD-10-CM | POA: Diagnosis not present

## 2019-07-31 DIAGNOSIS — Z79890 Hormone replacement therapy (postmenopausal): Secondary | ICD-10-CM | POA: Diagnosis not present

## 2019-07-31 DIAGNOSIS — Z820 Family history of epilepsy and other diseases of the nervous system: Secondary | ICD-10-CM | POA: Diagnosis not present

## 2019-07-31 LAB — CREATININE, SERUM
Creatinine, Ser: 1.75 mg/dL — ABNORMAL HIGH (ref 0.44–1.00)
GFR calc Af Amer: 32 mL/min — ABNORMAL LOW (ref >60)
GFR calc non Af Amer: 27 mL/min — ABNORMAL LOW (ref >60)

## 2019-07-31 LAB — FERRITIN: Ferritin: 600 ng/mL — ABNORMAL HIGH (ref 11–307)

## 2019-07-31 LAB — URINALYSIS, COMPLETE (UACMP) WITH MICROSCOPIC
Bacteria, UA: NONE SEEN
Bilirubin Urine: NEGATIVE
Glucose, UA: NEGATIVE mg/dL
Hgb urine dipstick: NEGATIVE
Ketones, ur: NEGATIVE mg/dL
Leukocytes,Ua: NEGATIVE
Nitrite: NEGATIVE
Protein, ur: 30 mg/dL — AB
Specific Gravity, Urine: 1.03 (ref 1.005–1.030)
pH: 5 (ref 5.0–8.0)

## 2019-07-31 LAB — FIBRIN DERIVATIVES D-DIMER (ARMC ONLY): Fibrin derivatives D-dimer (ARMC): 504.72 ng/mL (FEU) — ABNORMAL HIGH (ref 0.00–499.00)

## 2019-07-31 LAB — C-REACTIVE PROTEIN: CRP: 2.6 mg/dL — ABNORMAL HIGH (ref <1.0)

## 2019-07-31 LAB — PROTIME-INR
INR: 1.3 — ABNORMAL HIGH (ref 0.8–1.2)
Prothrombin Time: 15.7 seconds — ABNORMAL HIGH (ref 11.4–15.2)

## 2019-07-31 LAB — HEMOGLOBIN A1C
Hgb A1c MFr Bld: 7.8 % — ABNORMAL HIGH (ref 4.8–5.6)
Mean Plasma Glucose: 177.16 mg/dL

## 2019-07-31 LAB — TROPONIN I (HIGH SENSITIVITY): Troponin I (High Sensitivity): 13 ng/L (ref <18)

## 2019-07-31 LAB — GLUCOSE, CAPILLARY
Glucose-Capillary: 266 mg/dL — ABNORMAL HIGH (ref 70–99)
Glucose-Capillary: 195 mg/dL — ABNORMAL HIGH (ref 70–99)

## 2019-07-31 LAB — PROCALCITONIN: Procalcitonin: <0.1 ng/mL

## 2019-07-31 LAB — LACTIC ACID, PLASMA: Lactic Acid, Venous: 1 mmol/L (ref 0.5–1.9)

## 2019-07-31 LAB — FIBRINOGEN: Fibrinogen: 471 mg/dL (ref 210–475)

## 2019-07-31 LAB — LACTATE DEHYDROGENASE: LDH: 186 U/L (ref 98–192)

## 2019-07-31 LAB — TRIGLYCERIDES: Triglycerides: 105 mg/dL (ref <150)

## 2019-07-31 LAB — ABO/RH: ABO/RH(D): A POS

## 2019-07-31 MED ORDER — ENOXAPARIN SODIUM 40 MG/0.4ML ~~LOC~~ SOLN: 40.0000 mg | Freq: Two times a day (BID) | SUBCUTANEOUS | Status: DC

## 2019-07-31 MED ORDER — SODIUM CHLORIDE 0.9 % IV SOLN
100.0000 mg | Freq: Every day | INTRAVENOUS | Status: AC
  Administered 2019-08-01 – 2019-08-04 (×4): 100 mg via INTRAVENOUS
  Filled 2019-07-31 (×4): qty 100

## 2019-07-31 MED ORDER — WARFARIN SODIUM 7.5 MG PO TABS
7.5000 mg | ORAL_TABLET | Freq: Once | ORAL | Status: AC
  Administered 2019-07-31: 15:00:00 7.5 mg via ORAL
  Filled 2019-07-31: qty 1

## 2019-07-31 MED ORDER — GUAIFENESIN-DM 100-10 MG/5ML PO SYRP
10.0000 mL | ORAL_SOLUTION | ORAL | Status: DC | PRN
  Filled 2019-07-31: qty 10

## 2019-07-31 MED ORDER — INSULIN ASPART 100 UNIT/ML ~~LOC~~ SOLN: 0.0000 [IU] | Freq: Every day | SUBCUTANEOUS | Status: DC

## 2019-07-31 MED ORDER — INSULIN ASPART 100 UNIT/ML ~~LOC~~ SOLN
0.0000 [IU] | Freq: Three times a day (TID) | SUBCUTANEOUS | Status: DC
  Administered 2019-07-31: 8 [IU] via SUBCUTANEOUS
  Administered 2019-08-01 (×3): 3 [IU] via SUBCUTANEOUS
  Administered 2019-08-02 – 2019-08-04 (×6): 2 [IU] via SUBCUTANEOUS
  Filled 2019-07-31 (×10): qty 1

## 2019-07-31 MED ORDER — SODIUM CHLORIDE 0.9 % IV SOLN: INTRAVENOUS | Status: DC

## 2019-07-31 MED ORDER — ADULT MULTIVITAMIN W/MINERALS CH
1.0000 | ORAL_TABLET | Freq: Every day | ORAL | Status: DC
  Administered 2019-07-31 – 2019-08-04 (×5): 1 via ORAL
  Filled 2019-07-31 (×5): qty 1

## 2019-07-31 MED ORDER — ONDANSETRON HCL 4 MG PO TABS
4.0000 mg | ORAL_TABLET | Freq: Four times a day (QID) | ORAL | Status: DC | PRN
  Administered 2019-08-02 – 2019-08-03 (×2): 4 mg via ORAL
  Filled 2019-07-31 (×2): qty 1

## 2019-07-31 MED ORDER — WARFARIN - PHARMACIST DOSING INPATIENT
Freq: Every day | Status: DC
  Filled 2019-07-31: qty 1

## 2019-07-31 MED ORDER — SODIUM CHLORIDE 0.9 % IV BOLUS
500.0000 mL | Freq: Once | INTRAVENOUS | Status: AC
  Administered 2019-07-31: 500 mL via INTRAVENOUS

## 2019-07-31 MED ORDER — DEXAMETHASONE 4 MG PO TABS
6.0000 mg | ORAL_TABLET | Freq: Every day | ORAL | Status: DC
  Administered 2019-07-31: 03:00:00 6 mg via ORAL
  Filled 2019-07-31: qty 1.5

## 2019-07-31 MED ORDER — ALBUTEROL SULFATE HFA 108 (90 BASE) MCG/ACT IN AERS
2.0000 | INHALATION_SPRAY | Freq: Four times a day (QID) | RESPIRATORY_TRACT | Status: DC
  Administered 2019-07-31 – 2019-08-04 (×17): 2 via RESPIRATORY_TRACT
  Filled 2019-07-31: qty 6.7

## 2019-07-31 MED ORDER — SODIUM CHLORIDE 0.9 % IV SOLN
200.0000 mg | Freq: Once | INTRAVENOUS | Status: AC
  Administered 2019-07-31: 200 mg via INTRAVENOUS
  Filled 2019-07-31: qty 200

## 2019-07-31 MED ORDER — ONDANSETRON HCL 4 MG/2ML IJ SOLN
4.0000 mg | Freq: Four times a day (QID) | INTRAMUSCULAR | Status: DC | PRN
  Administered 2019-08-01: 4 mg via INTRAVENOUS
  Filled 2019-07-31 (×2): qty 2

## 2019-07-31 MED ORDER — ZINC SULFATE 220 (50 ZN) MG PO CAPS
220.0000 mg | ORAL_CAPSULE | Freq: Every day | ORAL | Status: DC
  Administered 2019-07-31 – 2019-08-04 (×5): 220 mg via ORAL
  Filled 2019-07-31 (×5): qty 1

## 2019-07-31 MED ORDER — ASCORBIC ACID 500 MG PO TABS
500.0000 mg | ORAL_TABLET | Freq: Every day | ORAL | Status: DC
  Administered 2019-07-31 – 2019-08-04 (×5): 500 mg via ORAL
  Filled 2019-07-31 (×5): qty 1

## 2019-07-31 NOTE — Progress Notes (Addendum)
  PROGRESS NOTE    Hannah Robertson  BJY:[id number] DOB: 01-20-1941 DOA: 07/30/2019  PCP: [facility]    LOS - 0    Patient admitted overnight, COVID+ with GI symptoms and hypoxia.  This morning she says feeling a little better.  Had poor appetite and says hadn't eaten in a couple of days.  Says feeling hungry again and requesting diet to be advanced from clear liquids.  No longer febrile this afternoon.    On 2 L/min O2 nasal cannula Comfortable appearing, in no acute distress, obese Abdomen nontender, normal bowel sounds Lungs diminished Heart RRR, 2+ pedal pulses   I have reviewed the full H&P by Dr. Damita Dunnings, and I agree with the assessment and plan as outlined therein. In addition:  --advance diet to heart healthy/carb modified --add sliding scale insulin coverage --resume home meds for chronic conditions once med rec completed --warfarin dosing per pharmacy     Ezekiel Slocumb, DO [facility]   If 7PM-7AM, please contact night-coverage [email or web address] Password [facility] 07/31/2019, 8:36 AM

## 2019-07-31 NOTE — ED Notes (Signed)
ED TO INPATIENT HANDOFF REPORT  ED Nurse Name and Phone #: [phone]  S Name/Age/Gender Hannah Robertson 78 y.o. female Room/Bed: ED10A/ED10A  Code Status   Code Status: Full Code  Home/SNF/Other Home Patient oriented to: self and situation Is this baseline? Yes   Triage Complete: Triage complete  Chief Complaint Pneumonia due to COVID-19 virus [U07.1, J12.89]  Triage Note First RN Note: Pt presents to ED via POV from [facility] with c/o fever, diarrhea, NP at [facility] reports confusion, Covid+ at [facility] today with decreased PO intake today. Pt with fever at [facility] of 101.5, other VS stable.    Arrives to ED states she with c/o nausea x 1 day and fever and diarrhea.  Denies emesis.  Patient is AAOx3.  Skin warm and dry. NAD.  Tested COVID positive today at [facility].    Allergies Allergies  Allergen Reactions  . Gabapentin     Rash and itching    Level of Care/Admitting Diagnosis ED Disposition    ED Disposition Condition Comment   Admit  Hospital Area: [facility] [[id number]]  Level of Care: Telemetry [5]  Covid Evaluation: Confirmed COVID Positive  Diagnosis: Pneumonia due to COVID-19 virus [[id number]]  Admitting Physician: Andris Baumann [[id number]]  Attending Physician: Andris Baumann [[id number]]  Estimated length of stay: 3 - 4 days  Certification:: I certify this patient will need inpatient services for at least 2 midnights       B Medical/Surgery History Past Medical History:  Diagnosis Date  . COVID-19 07/30/2019  . Diabetes mellitus without complication (HCC)   . GERD (gastroesophageal reflux disease)   . H/O blood clots 2014  . Hyperlipidemia   . Hypertension    Past Surgical History:  Procedure Laterality Date  . ABDOMINAL HYSTERECTOMY    . CESAREAN SECTION    . CHOLECYSTECTOMY    . COLONOSCOPY  11/27/13  . ivc filter  2014  . SHOULDER SURGERY    . TONSILLECTOMY       A IV Location/Drains/Wounds Patient Lines/Drains/Airways Status   Active  Line/Drains/Airways    Name:   Placement date:   Placement time:   Site:   Days:   Peripheral IV 07/31/19 Left;Posterior Forearm   07/31/19    0059    Forearm   less than 1          Intake/Output Last 24 hours  Intake/Output Summary (Last 24 hours) at 07/31/2019 1420 Last data filed at 07/31/2019 1300 Gross per 24 hour  Intake 480 ml  Output --  Net 480 ml    Labs/Imaging Results for orders placed or performed during the hospital encounter of 07/30/19 (from the past 48 hour(s))  Lipase, blood     Status: None   Collection Time: 07/30/19  5:02 PM  Result Value Ref Range   Lipase 49 11 - 51 U/L    Comment: Performed at [facility], [address] [place], [place] [address]  Comprehensive metabolic panel     Status: Abnormal   Collection Time: 07/30/19  5:02 PM  Result Value Ref Range   Sodium 136 135 - 145 mmol/L   Potassium 3.3 (L) 3.5 - 5.1 mmol/L   Chloride 103 98 - 111 mmol/L   CO2 20 (L) 22 - 32 mmol/L   Glucose, Bld 183 (H) 70 - 99 mg/dL   BUN 23 8 - 23 mg/dL   Creatinine, Ser 7.06 (H) 0.44 - 1.00 mg/dL   Calcium 9.3 8.9 - 23.7 mg/dL  Total Protein 7.6 6.5 - 8.1 g/dL   Albumin 4.2 3.5 - 5.0 g/dL   AST 28 15 - 41 U/L   ALT 18 0 - 44 U/L   Alkaline Phosphatase 38 38 - 126 U/L   Total Bilirubin 0.8 0.3 - 1.2 mg/dL   GFR calc non Af Amer 30 (L) >60 mL/min   GFR calc Af Amer 34 (L) >60 mL/min   Anion gap 13 5 - 15    Comment: Performed at [facility], [address] [place], [place] [address]  CBC     Status: None   Collection Time: 07/30/19  5:02 PM  Result Value Ref Range   WBC 6.8 4.0 - 10.5 K/uL   RBC 4.83 3.87 - 5.11 MIL/uL   Hemoglobin 12.6 12.0 - 15.0 g/dL   HCT 40.9 36.0 - 46.0 %   MCV 84.7 80.0 - 100.0 fL   MCH 26.1 26.0 - 34.0 pg   MCHC 30.8 30.0 - 36.0 g/dL   RDW 15.1 11.5 - 15.5 %   Platelets 235 150 - 400 K/uL   nRBC 0.0 0.0 - 0.2 %    Comment: Performed at [facility], [address] [place], [place]  [address]  Urinalysis, Complete w Microscopic     Status: Abnormal   Collection Time: 07/30/19 11:55 PM  Result Value Ref Range   Color, Urine AMBER (A) YELLOW    Comment: BIOCHEMICALS MAY BE AFFECTED BY COLOR   APPearance HAZY (A) CLEAR   Specific Gravity, Urine 1.030 1.005 - 1.030   pH 5.0 5.0 - 8.0   Glucose, UA NEGATIVE NEGATIVE mg/dL   Hgb urine dipstick NEGATIVE NEGATIVE   Bilirubin Urine NEGATIVE NEGATIVE   Ketones, ur NEGATIVE NEGATIVE mg/dL   Protein, ur 30 (A) NEGATIVE mg/dL   Nitrite NEGATIVE NEGATIVE   Leukocytes,Ua NEGATIVE NEGATIVE   RBC / HPF 0-5 0 - 5 RBC/hpf   WBC, UA 0-5 0 - 5 WBC/hpf   Bacteria, UA NONE SEEN NONE SEEN   Squamous Epithelial / LPF 0-5 0 - 5   Mucus PRESENT    Hyaline Casts, UA PRESENT    Amorphous Crystal PRESENT     Comment: Performed at [facility], [address] [place], [place] [address]  Fibrin derivatives D-Dimer     Status: Abnormal   Collection Time: 07/30/19 11:56 PM  Result Value Ref Range   Fibrin derivatives D-dimer ([facility]) 504.72 (H) 0.00 - 499.00 ng/mL (FEU)    Comment: (NOTE) <> Exclusion of Venous Thromboembolism (VTE) - OUTPATIENT ONLY   (Emergency Department or [place])   0-499 ng/ml (FEU): With a low to intermediate pretest probability                      for VTE this test result excludes the diagnosis                      of VTE.   >499 ng/ml (FEU) : VTE not excluded; additional work up for VTE is                      required. <> Testing on Inpatients and Evaluation of Disseminated Intravascular   Coagulation (DIC) Reference Range:   0-499 ng/ml (FEU) Performed at [facility], [address], [place], [place] [address]   Procalcitonin     Status: None   Collection Time: 07/30/19 11:56 PM  Result Value Ref Range  Procalcitonin <0.10 ng/mL    Comment:        Interpretation: PCT (Procalcitonin) <= 0.5 ng/mL: Systemic infection (sepsis) is not likely. Local bacterial infection is  possible. (NOTE)       Sepsis PCT Algorithm           Lower Respiratory Tract                                      Infection PCT Algorithm    ----------------------------     ----------------------------         PCT < 0.25 ng/mL                PCT < 0.10 ng/mL         Strongly encourage             Strongly discourage   discontinuation of antibiotics    initiation of antibiotics    ----------------------------     -----------------------------       PCT 0.25 - 0.50 ng/mL            PCT 0.10 - 0.25 ng/mL               OR       >80% decrease in PCT            Discourage initiation of                                            antibiotics      Encourage discontinuation           of antibiotics    ----------------------------     -----------------------------         PCT >= 0.50 ng/mL              PCT 0.26 - 0.50 ng/mL               AND        <80% decrease in PCT             Encourage initiation of                                             antibiotics       Encourage continuation           of antibiotics    ----------------------------     -----------------------------        PCT >= 0.50 ng/mL                  PCT > 0.50 ng/mL               AND         increase in PCT                  Strongly encourage                                      initiation of antibiotics    Strongly encourage escalation           of antibiotics                                     -----------------------------  PCT <= 0.25 ng/mL                                                 OR                                        > 80% decrease in PCT                                     Discontinue / Do not initiate                                             antibiotics Performed at [facility], [address] [place], [place] [address]   Fibrinogen     Status: None   Collection Time: 07/30/19 11:56 PM  Result Value Ref Range   Fibrinogen 471 210 - 475 mg/dL     Comment: Performed at [facility], [address] [place], [place] [address]  ABO/Rh     Status: None   Collection Time: 07/31/19  1:38 AM  Result Value Ref Range   ABO/RH(D)      A POS Performed at [facility], [address] [place], [place] [address]   Creatinine, serum     Status: Abnormal   Collection Time: 07/31/19  1:38 AM  Result Value Ref Range   Creatinine, Ser 1.75 (H) 0.44 - 1.00 mg/dL   GFR calc non Af Amer 27 (L) >60 mL/min   GFR calc Af Amer 32 (L) >60 mL/min    Comment: Performed at [facility], [address] [place], [place] [address]  Blood Culture (routine x 2)     Status: None (Preliminary result)   Collection Time: 07/31/19  1:48 AM   Specimen: Left Antecubital; Blood  Result Value Ref Range   Specimen Description LEFT ANTECUBITAL    Special Requests      BOTTLES DRAWN AEROBIC AND ANAEROBIC Blood Culture adequate volume   Culture      NO GROWTH < 12 HOURS Performed at [facility], [address] [place], [place] [address]    Report Status PENDING   Lactate dehydrogenase     Status: None   Collection Time: 07/31/19  1:48 AM  Result Value Ref Range   LDH 186 98 - 192 U/L    Comment: Performed at [facility], [address] [place], [place] [address]  Ferritin     Status: Abnormal   Collection Time: 07/31/19  1:48 AM  Result Value Ref Range   Ferritin 600 (H) 11 - 307 ng/mL    Comment: Performed at [facility], [address] [place], [place] [address]  Triglycerides     Status: None   Collection Time: 07/31/19  1:48 AM  Result Value Ref Range   Triglycerides 105 <150 mg/dL    Comment: Performed at [facility], [address] [place], [place] [address]  C-reactive protein     Status: Abnormal   Collection Time: 07/31/19  1:48 AM  Result Value Ref  Range   CRP 2.6 (H) <1.0 mg/dL    Comment: Performed at [facility], [address], [place], [place] [address]   Troponin I (High Sensitivity)     Status: None   Collection Time: 07/31/19  1:48 AM  Result Value Ref Range   Troponin I (High Sensitivity) 13 <18 ng/L    Comment: (NOTE) Elevated high sensitivity troponin I (hsTnI) values and significant  changes across serial measurements may suggest ACS but many other  chronic and acute conditions are known to elevate hsTnI results.  Refer to the "Links" section for chest pain algorithms and additional  guidance. Performed at [facility], [address] [place], [place] [address]   Protime-INR     Status: Abnormal   Collection Time: 07/31/19  1:48 AM  Result Value Ref Range   Prothrombin Time 15.7 (H) 11.4 - 15.2 seconds   INR 1.3 (H) 0.8 - 1.2    Comment: (NOTE) INR goal varies based on device and disease states. Performed at [facility], [address] [place], [place] [address]   Lactic acid, plasma     Status: None   Collection Time: 07/31/19  1:50 AM  Result Value Ref Range   Lactic Acid, Venous 1.0 0.5 - 1.9 mmol/L    Comment: Performed at [facility], [address] [place], [place] [address]  Blood Culture (routine x 2)     Status: None (Preliminary result)   Collection Time: 07/31/19  4:00 AM   Specimen: BLOOD  Result Value Ref Range   Specimen Description BLOOD LEFT FA    Special Requests      BOTTLES DRAWN AEROBIC AND ANAEROBIC Blood Culture adequate volume   Culture      NO GROWTH <12 HOURS Performed at [facility], [address], [place], [place] [address]    Report Status PENDING    DG Chest Port 1 View  Result Date: 07/30/2019 CLINICAL DATA:  Fatigue, COVID-19 exposure, fever 1 day prior EXAM: PORTABLE CHEST 1 VIEW COMPARISON:  Radiograph Dec 25, 2012, CT March 11, 2010 FINDINGS: Lung volumes are low with some increasing streaky opacities in the lung bases possibly accentuated by body habitus. No pneumothorax or effusion. Mild cardiomegaly with a calcified tortuous aorta.  No acute osseous or soft tissue abnormality. Degenerative changes are present in the imaged spine and shoulders. Stable surgical clip seen in the base of the left neck. IMPRESSION: 1. Low lung volumes with some increasing streaky opacities in the lung bases, possibly reflecting atelectasis or early infection 2. Mild cardiomegaly. 3.  Aortic Atherosclerosis (ICD10-I70.0). Electronically Signed   By: Kreg Shropshire M.D.   On: 07/30/2019 23:29    Pending Labs Unresulted Labs (From admission, onward)    Start     Ordered   08/01/19 0500  CBC with Differential/Platelet  Tomorrow morning,   STAT    Question:  Specimen collection method  Answer:  IV Team=IV Team collect   07/31/19 0146   08/01/19 0500  Comprehensive metabolic panel  Tomorrow morning,   STAT    Question:  Specimen collection method  Answer:  IV Team=IV Team collect   07/31/19 0146   08/01/19 0500  C-reactive protein  Tomorrow morning,   STAT    Question:  Specimen collection method  Answer:  IV Team=IV Team collect   07/31/19 0146   08/01/19 0500  Protime-INR  Daily,   STAT    Question:  Specimen collection method  Answer:  IV Team=IV Team collect   07/31/19 0248   07/31/19 1322  Hemoglobin A1c  Once,   STAT    Comments: To assess prior glycemic control   Question:  Specimen collection method  Answer:  IV Team=IV Team collect   07/31/19 1321   07/30/19 2315  Urine culture  ONCE - STAT,   STAT    Question:  Patient immune status  Answer:  Normal   07/30/19 2314          Vitals/Pain Today's Vitals   07/31/19 0831 07/31/19 0832 07/31/19 1117 07/31/19 1304  BP: 138/67  (!) 156/65 (!) 122/106  Pulse: 65  64 66  Resp: Temp: 99.3 F (37.4 C)     TempSrc: Oral     SpO2: 95%  100% 100%  Weight:      Height:      PainSc: 0-No pain 0-No pain  0-No pain    Isolation Precautions Airborne and Contact precautions  Medications Medications  sodium chloride flush (NS) 0.9 % injection 3 mL (has no administration in  time range)  0.9 %  sodium chloride infusion ( Intravenous New Bag/Given 07/31/19 1417)  remdesivir 200 mg in sodium chloride 0.9% 250 mL IVPB (0 mg Intravenous Stopped 07/31/19 0403)    Followed by  remdesivir 100 mg in sodium chloride 0.9 % 100 mL IVPB (has no administration in time range)  albuterol (VENTOLIN HFA) 108 (90 Base) MCG/ACT inhaler 2 puff (2 puffs Inhalation Given 07/31/19 1419)  dexamethasone (DECADRON) tablet 6 mg (6 mg Oral Given 07/31/19 0327)  guaiFENesin-dextromethorphan (ROBITUSSIN DM) 100-10 MG/5ML syrup 10 mL (has no administration in time range)  ascorbic acid (VITAMIN C) tablet 500 mg (500 mg Oral Given 07/31/19 0955)  zinc sulfate capsule 220 mg (220 mg Oral Given 07/31/19 0955)  ondansetron (ZOFRAN) tablet 4 mg (has no administration in time range)    Or  ondansetron (ZOFRAN) injection 4 mg (has no administration in time range)  multivitamin with minerals tablet 1 tablet (1 tablet Oral Given 07/31/19 0955)  insulin aspart (novoLOG) injection 0-15 Units (has no administration in time range)  insulin aspart (novoLOG) injection 0-5 Units (has no administration in time range)  warfarin (COUMADIN) tablet 7.5 mg (has no administration in time range)  Warfarin - Pharmacist Dosing Inpatient (has no administration in time range)  acetaminophen (TYLENOL) tablet 1,000 mg (1,000 mg Oral Given 07/30/19 1704)  ibuprofen (ADVIL) tablet 600 mg (600 mg Oral Given 07/30/19 2254)  sodium chloride 0.9 % bolus 500 mL (0 mLs Intravenous Stopped 07/31/19 0515)    Mobility walks with device Low fall risk   Focused Assessments Pulmonary Assessment Handoff:  Lung sounds:   O2 Device: Nasal Cannula O2 Flow Rate (L/min): 2 L/min      R Recommendations: See Admitting Provider Note  Report given to:   Additional Notes: COVID

## 2019-07-31 NOTE — Consult Note (Addendum)
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 Gabapentin     Rash and itching    Patient Measurements: Height: 5\' 2"  (157.5 cm) Weight: 240 lb 1.3 oz (108.9 kg) IBW/kg (Calculated) : 50.1   Vital Signs: Temp: 99.3 F (37.4 C) (12/25 0831) Temp Source: Oral (12/25 0831) BP: 122/106 (12/25 1304) Pulse Rate: 66 (12/25 1304)  Labs: Recent Labs    07/30/19 1702 07/31/19 0138 07/31/19 0148  HGB 12.6  --   --   HCT 40.9  --   --   PLT 235  --   --   LABPROT  --   --  15.7*  INR  --   --  1.3*  CREATININE 1.63* 1.75*  --   TROPONINIHS  --   --  13    Estimated Creatinine Clearance: 30.3 mL/min (A) (by C-G formula based on SCr of 1.75 mg/dL (H)).   Medical History: Past Medical History:  Diagnosis Date  . COVID-19 07/30/2019  . Diabetes mellitus without complication ([place])   . GERD (gastroesophageal reflux disease)   . H/O blood clots 2014  . Hyperlipidemia   . Hypertension     Assessment: Pharmacy consulted for warfarin dosing and monitoring in 78 yo female with PMH of PE. INR subtherapeutic on admission.  Last dose reported on 12/23.   Home Regimen: Warfarin 5mg : Mon, Wed, Fri, Sat                             Warfarin 7.5mg : Sun, Tues, Thurs  DATE INR DOSE 12/25 1.3  Goal of Therapy:  INR 2-3 Monitor platelets by anticoagulation protocol: Yes   Plan:  12/25 INR subtherapeutic on admission. Will order warfarin 7.5 mg x 1 dose now.  F/U INR ordered with AM labs.   Pernell Dupre, PharmD, BCPS Clinical Pharmacist 07/31/2019 1:34 PM

## 2019-07-31 NOTE — H&P (Signed)
History and Physical    Hannah MalesDorothy V Kohrs RUE:[id number] DOB: [birth year] DOA: 07/30/2019  PCP: [facility]   Patient coming from: home  I have personally briefly reviewed patient's [facility]  Chief Complaint: Diarrhea, cough and weakness  HPI: Hannah Robertson is a 78 y.o. female with medical history significant insulin-dependent type 2 diabetes, hypertension on chronic anticoagulation with Coumadin due to history of PE who presents to the emergency room sent from the urgent care with diarrhea, cough and weakness.  Patient tested positive for Covid.  States his symptoms started suddenly 2 days ago and she has had than 10 loose bowel movements daily.  He has nausea but denies vomiting.  Denies abdominal pain and denies fever.  She has a cough that developed 2 days ago but denies shortness of breath.  No chest pain.  In the emergency room she was febrile at 100.9 and noted to be hypotensive at 95/55.  Pulse was 65.  Blood work was significant for creatinine of 1.63 up from a baseline of 0.8.  Chest x-ray showed streaky opacities, representing early infection/atelectasis.  Hospitalist service consulted for admission   Review of Systems: As per HPI otherwise 10 point review of systems negative.    Past Medical History:  Diagnosis Date  . COVID-19 07/30/2019  . Diabetes mellitus without complication (HCC)   . GERD (gastroesophageal reflux disease)   . H/O blood clots 2014  . Hyperlipidemia   . Hypertension     Past Surgical History:  Procedure Laterality Date  . ABDOMINAL HYSTERECTOMY    . CESAREAN SECTION    . CHOLECYSTECTOMY    . COLONOSCOPY  11/27/13  . ivc filter  2014  . SHOULDER SURGERY    . TONSILLECTOMY       reports that she has never smoked. She has never used smokeless tobacco. She reports that she does not drink alcohol or use drugs.  No Known Allergies  Family History  Problem Relation Age of Onset  . Alzheimer's disease  Mother   . Hypertension Mother   . Prostate cancer Father   . Hypertension Father      Prior to Admission medications   Medication Sig Start Date End Date Taking? Authorizing Provider  acetaminophen (TYLENOL) 325 MG tablet Take 325 mg by mouth every 6 (six) hours as needed.    [provider]  amLODipine (NORVASC) 10 MG tablet Take 10 mg by mouth daily.  04/07/14   [provider]  cetirizine (ZYRTEC) 5 MG tablet Take 1 tablet (5 mg total) by mouth daily. 07/16/18   Tommie Samsook, Jayce G, DO  famotidine (PEPCID) 20 MG tablet Take 1 tablet (20 mg total) by mouth 2 (two) times daily. 07/16/18   Tommie Samsook, Jayce G, DO  fluticasone (FLONASE) 50 MCG/ACT nasal spray Place 2 sprays into both nostrils daily.  04/07/14   [provider]  HYDROcodone-acetaminophen (NORCO/VICODIN) 5-325 MG tablet 1 tablet every 4-6 hours as needed for pain. 02/28/16   Tommi RumpsSummers, Rhonda L, PA-C  LANTUS SOLOSTAR 100 UNIT/ML Solostar Pen Inject 32 Units into the skin 2 (two) times daily.  04/20/14   [provider]  levothyroxine (SYNTHROID) 112 MCG tablet Take 112 mcg by mouth daily. 07/25/19   [provider]  losartan-hydrochlorothiazide (HYZAAR) 50-12.5 MG per tablet Take 1 tablet by mouth daily.  07/05/14   [provider]  pravastatin (PRAVACHOL) 40 MG tablet Take 40 mg by mouth daily.  04/07/14   [provider]  triamcinolone ointment (KENALOG) 0.5 % Apply 1 application topically 2 (two) times daily. 07/16/18   Tommie Sams, DO  warfarin (COUMADIN) 2.5 MG tablet  05/04/14   [provider]    Physical Exam: Vitals:   07/31/19 0125 07/31/19 0126 07/31/19 0127 07/31/19 0128  BP:      Pulse: 65 65 65 65  Resp: 18 17 18 18   Temp:      TempSrc:      SpO2: 96% 95% 95% 95%  Weight:      Height:         Vitals:   07/31/19 0125 07/31/19 0126 07/31/19 0127 07/31/19 0128  BP:      Pulse: 65 65 65 65  Resp: 18 17 18 18   Temp:      TempSrc:      SpO2: 96% 95%  95% 95%  Weight:      Height:        Constitutional: NAD, calm, comfortable Eyes: PERRL, lids and conjunctivae normal ENMT: Mucous membranes are moist. Neck: normal, supple, no masses, no thyromegaly Respiratory: clear to auscultation bilaterally, no wheezing, no crackles. Normal respiratory effort. No accessory muscle use.  Cardiovascular: Regular rate and rhythm, no murmurs / rubs / gallops. No extremity edema. 2+ pedal pulses. No carotid bruits.  Abdomen: no tenderness, no masses palpated. No hepatosplenomegaly. Bowel sounds positive.  Musculoskeletal: no clubbing / cyanosis. No joint deformity upper and lower extremities. Good ROM, no contractures. Normal muscle tone.  Skin: no rashes, lesions, ulcers. No induration Neurologic: CN 2-12 grossly intact. Sensation intact, DTR normal. Strength 5/5 in all 4.  Psychiatric: Normal judgment and insight. Alert and oriented x 3. Normal mood.    Labs on Admission: I have personally reviewed following labs and imaging studies  CBC: Recent Labs  Lab 07/30/19 1702  WBC 6.8  HGB 12.6  HCT 40.9  MCV 84.7  PLT 235   Basic Metabolic Panel: Recent Labs  Lab 07/30/19 1702  NA 136  K 3.3*  CL 103  CO2 20*  GLUCOSE 183*  BUN 23  CREATININE 1.63*  CALCIUM 9.3   GFR: Estimated Creatinine Clearance: 32.5 mL/min (A) (by C-G formula based on SCr of 1.63 mg/dL (H)). Liver Function Tests: Recent Labs  Lab 07/30/19 1702  AST 28  ALT 18  ALKPHOS 38  BILITOT 0.8  PROT 7.6  ALBUMIN 4.2   Recent Labs  Lab 07/30/19 1702  LIPASE 49   No results for input(s): AMMONIA in the last 168 hours. Coagulation Profile: No results for input(s): INR, PROTIME in the last 168 hours. Cardiac Enzymes: No results for input(s): CKTOTAL, CKMB, CKMBINDEX, TROPONINI in the last 168 hours. BNP (last 3 results) No results for input(s): PROBNP in the last 8760 hours. HbA1C: No results for input(s): HGBA1C in the last 72 hours. CBG: No results for  input(s): GLUCAP in the last 168 hours. Lipid Profile: No results for input(s): CHOL, HDL, LDLCALC, TRIG, CHOLHDL, LDLDIRECT in the last 72 hours. Thyroid Function Tests: No results for input(s): TSH, T4TOTAL, FREET4, T3FREE, THYROIDAB in the last 72 hours. Anemia Panel: No results for input(s): VITAMINB12, FOLATE, FERRITIN, TIBC, IRON, RETICCTPCT in the last 72 hours. Urine analysis:    Component Value Date/Time   COLORURINE AMBER (A) 07/30/2019 2355   APPEARANCEUR HAZY (A) 07/30/2019 2355   APPEARANCEUR Clear 11/24/2013 1745   LABSPEC 1.030 07/30/2019 2355   LABSPEC 1.020 11/24/2013 1745   PHURINE 5.0 07/30/2019 2355   GLUCOSEU NEGATIVE  07/30/2019 2355   GLUCOSEU Negative 11/24/2013 1745   HGBUR NEGATIVE 07/30/2019 2355   BILIRUBINUR NEGATIVE 07/30/2019 2355   BILIRUBINUR Negative 11/24/2013 [place] 07/30/2019 2355   PROTEINUR 30 (A) 07/30/2019 2355   NITRITE NEGATIVE 07/30/2019 2355   LEUKOCYTESUR NEGATIVE 07/30/2019 2355   LEUKOCYTESUR Trace 11/24/2013 1745    Radiological Exams on Admission: DG Chest Port 1 View  Result Date: 07/30/2019 CLINICAL DATA:  Fatigue, COVID-19 exposure, fever 1 day prior EXAM: PORTABLE CHEST 1 VIEW COMPARISON:  Radiograph Dec 25, 2012, CT March 11, 2010 FINDINGS: Lung volumes are low with some increasing streaky opacities in the lung bases possibly accentuated by body habitus. No pneumothorax or effusion. Mild cardiomegaly with a calcified tortuous aorta. No acute osseous or soft tissue abnormality. Degenerative changes are present in the imaged spine and shoulders. Stable surgical clip seen in the base of the left neck. IMPRESSION: 1. Low lung volumes with some increasing streaky opacities in the lung bases, possibly reflecting atelectasis or early infection 2. Mild cardiomegaly. 3.  Aortic Atherosclerosis (ICD10-I70.0). Electronically Signed   By: Lovena Le M.D.   On: 07/30/2019 23:29    EKG: Independently  reviewed. Assessment/Plan    AKI (acute kidney injury) ([place]) --Secondary to hydration from hypovolemia from acute gastroenteritis related to COVID-19 --IV fluid bolus of 51ml then continue to complete 1liter. --Continue to monitor renal function and avoid nephrotoxins    Pneumonia due to COVID-19 virus with hypoxia --Chest x-ray showing streaky opacities at bases reflecting atelectasis or early infection. --Remdesivir, IV steroids, vitamins --supplemental oxygen to keep sats over 90% --Patient has only mild hypoxia with sats 90 to 91% room air  Viral gastroenteritis related to COVID-19 infection --Supportive care outlined above --IV antiemetics --Clear liquid diet and advance as tolerated    Hypotension due to hypovolemia --Systolic blood pressure in the emergency room was in the 90s map remained above 65 --IV hydration untiil improved with close monitoring for worsening respiratory symptoms in the setting of Covid 19 pneumonia. -- Hold home antihypertensives --Continue to monitor closely    Insulin dependent type 2 diabetes mellitus (HCC) --supplemental insulin only    Acquired hypothyroidism --continue levothyroxine    Chronic anticoagulation secondary to history of PE --daily INR --continue warfarin once hemoglobin stable --DVT prophylaxis necessary, once INR therapeutic     DVT prophylaxis: Patient on coagulation with Coumadin  code Status: full  Disposition Plan: Back to previous home environment Consults called: none  Admission status: in   Athena Masse MD [facility]   If 7PM-7AM, please contact night-coverage [email or web address] Password [email or web address]  07/31/2019, 1:48 AM

## 2019-07-31 NOTE — Progress Notes (Signed)
Remdesivir - Pharmacy Brief Note   O:  ALT: 18 CXR:  SpO2: 95% on RA   A/P:  Remdesivir 200 mg IVPB once followed by 100 mg IVPB daily x 4 days.   Hart Robinsons, PharmD 07/31/2019 2:01 AM

## 2019-07-31 NOTE — ED Notes (Signed)
Report to [facility].

## 2019-07-31 NOTE — Progress Notes (Signed)
PHARMACIST - PHYSICIAN COMMUNICATION  CONCERNING:  Enoxaparin (Lovenox) for DVT Prophylaxis   RECOMMENDATION: Patient was prescribed enoxaprin 40mg  q24 hours for VTE prophylaxis.   Filed Weights   07/30/19 1659  Weight: 240 lb 1.3 oz (108.9 kg)    Body mass index is 43.91 kg/m.  Estimated Creatinine Clearance: 32.5 mL/min (A) (by C-G formula based on SCr of 1.63 mg/dL (H)).  Based on [place] patient is candidate for enoxaparin 40mg  every 12 hour dosing due to BMI being >40.  DESCRIPTION: Pharmacy has adjusted enoxaparin dose per [facility] policy.  Patient is now receiving enoxaparin 40mg  every 12 hours.   Ena Dawley, PharmD Clinical Pharmacist  07/31/2019 1:54 AM

## 2019-08-01 DIAGNOSIS — I10 Essential (primary) hypertension: Secondary | ICD-10-CM

## 2019-08-01 DIAGNOSIS — Z7901 Long term (current) use of anticoagulants: Secondary | ICD-10-CM

## 2019-08-01 LAB — CBC WITH DIFFERENTIAL/PLATELET
Abs Immature Granulocytes: 0.02 10*3/uL (ref 0.00–0.07)
Basophils Absolute: 0 10*3/uL (ref 0.0–0.1)
Basophils Relative: 0 %
Eosinophils Absolute: 0 10*3/uL (ref 0.0–0.5)
Eosinophils Relative: 0 %
HCT: 35 % — ABNORMAL LOW (ref 36.0–46.0)
Hemoglobin: 11.4 g/dL — ABNORMAL LOW (ref 12.0–15.0)
Immature Granulocytes: 1 %
Lymphocytes Relative: 44 %
Lymphs Abs: 1.9 10*3/uL (ref 0.7–4.0)
MCH: 26.3 pg (ref 26.0–34.0)
MCHC: 32.6 g/dL (ref 30.0–36.0)
MCV: 80.8 fL (ref 80.0–100.0)
Monocytes Absolute: 0.6 10*3/uL (ref 0.1–1.0)
Monocytes Relative: 13 %
Neutro Abs: 1.8 10*3/uL (ref 1.7–7.7)
Neutrophils Relative %: 42 %
Platelets: 259 10*3/uL (ref 150–400)
RBC: 4.33 MIL/uL (ref 3.87–5.11)
RDW: 14.9 % (ref 11.5–15.5)
WBC: 4.3 10*3/uL (ref 4.0–10.5)
nRBC: 0 % (ref 0.0–0.2)

## 2019-08-01 LAB — COMPREHENSIVE METABOLIC PANEL
ALT: 17 U/L (ref 0–44)
AST: 26 U/L (ref 15–41)
Albumin: 3.5 g/dL (ref 3.5–5.0)
Alkaline Phosphatase: 34 U/L — ABNORMAL LOW (ref 38–126)
Anion gap: 11 (ref 5–15)
BUN: 23 mg/dL (ref 8–23)
CO2: 20 mmol/L — ABNORMAL LOW (ref 22–32)
Calcium: 8.3 mg/dL — ABNORMAL LOW (ref 8.9–10.3)
Chloride: 107 mmol/L (ref 98–111)
Creatinine, Ser: 0.83 mg/dL (ref 0.44–1.00)
GFR calc Af Amer: >60 mL/min (ref >60)
GFR calc non Af Amer: >60 mL/min (ref >60)
Glucose, Bld: 185 mg/dL — ABNORMAL HIGH (ref 70–99)
Potassium: 3.6 mmol/L (ref 3.5–5.1)
Sodium: 138 mmol/L (ref 135–145)
Total Bilirubin: 0.5 mg/dL (ref 0.3–1.2)
Total Protein: 6.8 g/dL (ref 6.5–8.1)

## 2019-08-01 LAB — GLUCOSE, CAPILLARY
Glucose-Capillary: 172 mg/dL — ABNORMAL HIGH (ref 70–99)
Glucose-Capillary: 165 mg/dL — ABNORMAL HIGH (ref 70–99)
Glucose-Capillary: 188 mg/dL — ABNORMAL HIGH (ref 70–99)
Glucose-Capillary: 168 mg/dL — ABNORMAL HIGH (ref 70–99)

## 2019-08-01 LAB — URINE CULTURE
Culture: NO GROWTH
Special Requests: NORMAL

## 2019-08-01 LAB — C-REACTIVE PROTEIN: CRP: 2 mg/dL — ABNORMAL HIGH (ref <1.0)

## 2019-08-01 LAB — PROTIME-INR
INR: 1.2 (ref 0.8–1.2)
Prothrombin Time: 15.3 seconds — ABNORMAL HIGH (ref 11.4–15.2)

## 2019-08-01 MED ORDER — WARFARIN SODIUM 7.5 MG PO TABS
7.5000 mg | ORAL_TABLET | Freq: Once | ORAL | Status: AC
  Administered 2019-08-01: 17:00:00 7.5 mg via ORAL
  Filled 2019-08-01: qty 1

## 2019-08-01 NOTE — Progress Notes (Addendum)
PROGRESS NOTE    Hannah Robertson  FSF:[id number] DOB: 1940-08-22 DOA: 07/30/2019  PCP: [facility]    LOS - 1   Brief Narrative:  78 y.o. female with medical history of insulin-dependent type 2 diabetes, hypertension on chronic anticoagulation with Coumadin due to history of PE who presented to the ED from urgent care with diarrhea, cough and generalized weakness, nausea without vomiting, very poor PO intake. Tested positive for Covid.  Sudden onset of symptoms 2 days prior.  In the ED, febrile 100.9, hypotensive 95/55.  Labs notable for creatinine 1.63 (from baseline 0.8).  Chest x-ray showed streaky opacities, representing early infection/atelectasis.  Admitted to hospitalist service for further management.  IV fluids for AKI, remdesivir, vitamins.    Subjective 12/26: Patient seen this morning, awake sitting up in bed.  No acute events reported overnight.  She reports that she continues to have intermittent bouts of nausea, but no vomiting.  Reports no more diarrhea.  Denies fevers or chills.  States she is tolerating diet and her breakfast was cold and she is hungry.    Assessment & Plan:   Principal Problem:   AKI (acute kidney injury) ([employer]) Active Problems:   Pneumonia due to COVID-19 virus   Viral gastroenteritis   Dehydration   Insulin dependent type 2 diabetes mellitus ([place])   Essential hypertension   Acquired hypothyroidism   Chronic anticoagulation   Hypotension due to hypovolemia    AKI (acute kidney injury) ([place]) - resolved  --Secondary to hydration from hypovolemia from acute gastroenteritis related to COVID-19 --resolved with IV hydration --monitor BMP --avoid nephrotoxins    Pneumonia due to COVID-19 virus with hypoxia --Chest x-ray showing streaky opacities at bases reflecting atelectasis or early infection. --Remdesivir, vitamins --Decadron started initially, stopped since no hypoxia / oxygen requirement --supplemental oxygen if needed to keep  sats over 90%  Viral gastroenteritis related to COVID-19 infection --Supportive care outlined above --IV antiemetics --Clear liquid diet and advance as tolerated    Hypotension due to hypovolemia --Systolic blood pressure in the emergency room was in the 90s map remained above 65 --IV hydration untiil improved with close monitoring for worsening respiratory symptoms in the setting of Covid 19 pneumonia. -- Hold home antihypertensives --Continue to monitor closely    Insulin dependent type 2 diabetes mellitus ([place]) --supplemental insulin only    Acquired hypothyroidism --continue levothyroxine    Chronic anticoagulation secondary to history of PE --daily INR --continue warfarin once hemoglobin stable --DVT prophylaxis necessary, once INR therapeutic      DVT prophylaxis: on warfarin   Code Status: Full Code  Family Communication: none at bedside  Disposition Plan:  Expect may be able to d/c home in 12/27, pending PT evaluation.   Consultants:   None  Procedures:   None  Antimicrobials:   None    Objective: Vitals:   07/31/19 1519 07/31/19 2025 08/01/19 0458 08/01/19 0806  BP: (!) 165/64 138/68 (!) 120/55 (!) 155/63  Pulse: 66 66 64 61  Resp: _0 Temp: 100 F (37.8 C) 99.5 F (37.5 C) 99.4 F (37.4 C) 98.9 F (37.2 C)  TempSrc: Oral Oral Oral Oral  SpO2: 98% 98% 96% 97%  Weight: 110.1 kg  112.7 kg   Height: _1  (1.575 m)       Intake/Output Summary (Last 24 hours) at 08/01/2019 0915 Last data filed at 08/01/2019 0414 Gross per 24 hour  Intake 1549.04 ml  Output 400 ml  Net 1149.04 ml  Filed Weights   07/30/19 1659 07/31/19 1519 08/01/19 0458  Weight: 108.9 kg 110.1 kg 112.7 kg    Examination:  General exam: awake, alert, no acute distress, obese HEENT: moist mucus membranes, hearing grossly normal  Respiratory system: clear to auscultation bilaterally, no wheezes, rales or rhonchi, normal respiratory effort. Cardiovascular  system: normal S1/S2, RRR, no JVD, murmurs, rubs, gallops, no pedal edema.   Gastrointestinal system: soft, non-tender, non-distended abdomen, no guarding or rebound tenderness, normal bowel sounds. Central nervous system: alert and oriented x3. no gross focal neurologic deficits, normal speech Extremities: moves all, no edema, normal tone Skin: dry, intact, normal temperature Psychiatry: normal mood, congruent affect, judgement and insight appear normal    Data Reviewed: I have personally reviewed following labs and imaging studies  CBC: Recent Labs  Lab 07/30/19 1702 08/01/19 0513  WBC 6.8 4.3  NEUTROABS  --  1.8  HGB 12.6 11.4*  HCT 40.9 35.0*  MCV 84.7 80.8  PLT 235 244   Basic Metabolic Panel: Recent Labs  Lab 07/30/19 1702 07/31/19 0138 08/01/19 0513  NA 136  --  138  K 3.3*  --  3.6  CL 103  --  107  CO2 20*  --  20*  GLUCOSE 183*  --  185*  BUN 23  --  23  CREATININE 1.63* 1.75* 0.83  CALCIUM 9.3  --  8.3*   GFR: Estimated Creatinine Clearance: 65.2 mL/min (by C-G formula based on SCr of 0.83 mg/dL). Liver Function Tests: Recent Labs  Lab 07/30/19 1702 08/01/19 0513  AST 28 26  ALT 18 17  ALKPHOS 38 34*  BILITOT 0.8 0.5  PROT 7.6 6.8  ALBUMIN 4.2 3.5   Recent Labs  Lab 07/30/19 1702  LIPASE 49   No results for input(s): AMMONIA in the last 168 hours. Coagulation Profile: Recent Labs  Lab 07/31/19 0148 08/01/19 0513  INR 1.3* 1.2   Cardiac Enzymes: No results for input(s): CKTOTAL, CKMB, CKMBINDEX, TROPONINI in the last 168 hours. BNP (last 3 results) No results for input(s): PROBNP in the last 8760 hours. HbA1C: Recent Labs    07/31/19 1548  HGBA1C 7.8*   CBG: Recent Labs  Lab 07/31/19 1633 07/31/19 2024 08/01/19 0803  GLUCAP 266* 195* 172*   Lipid Profile: Recent Labs    07/31/19 0148  TRIG 105   Thyroid Function Tests: No results for input(s): TSH, T4TOTAL, FREET4, T3FREE, THYROIDAB in the last 72 hours. Anemia  Panel: Recent Labs    07/31/19 0148  FERRITIN 600*   Sepsis Labs: Recent Labs  Lab 07/30/19 2356 07/31/19 0150  PROCALCITON <0.10  --   LATICACIDVEN  --  1.0    Recent Results (from the past 240 hour(s))  SARS Coronavirus 2 Ag (30 min TAT) - Nasal Swab (BD Veritor Kit)     Status: Abnormal   Collection Time: 07/30/19  3:57 PM   Specimen: Nasal Swab (BD Veritor Kit)  Result Value Ref Range Status   SARS Coronavirus 2 Ag POSITIVE (A) NEGATIVE Final    Comment: RESULT CALLED TO, READ BACK BY AND VERIFIED WITH: BRYAN GRAY 07/30/19 1615 [place] (NOTE) SARS-CoV-2 antigen PRESENT. Positive results indicate the presence of viral antigens, but clinical correlation with patient history and other diagnostic information is necessary to determine patient infection status.  Positive results do not rule out bacterial infection or co-infection  with other viruses. False positive results are rare but can occur, and confirmatory RT-PCR testing may be appropriate in some circumstances. The  expected result is Negative. Fact Sheet for Patients: PodPark.tn Fact Sheet for Providers: GiftContent.is  This test is not yet approved or cleared by the [place] FDA and  has been authorized for detection and/or diagnosis of SARS-CoV-2 by FDA under an Emergency Use Authorization (EUA).  This EUA will remain in effect (meaning this test can be used) for the duration of  the COVID-19 declaration u nder Section 564(b)(1) of the Act, 21 U.S.C. section 360bbb-3(b)(1), unless the authorization is terminated or revoked sooner. Performed at [facility], [address], [place], [place] [address]   Blood Culture (routine x 2)     Status: None (Preliminary result)   Collection Time: 07/31/19  1:48 AM   Specimen: Left Antecubital; Blood  Result Value Ref Range Status   Specimen Description LEFT ANTECUBITAL  Final   Special Requests    Final    BOTTLES DRAWN AEROBIC AND ANAEROBIC Blood Culture adequate volume   Culture   Final    NO GROWTH 1 DAY Performed at [facility], [address] [place], [place] [address]    Report Status PENDING  Incomplete  Blood Culture (routine x 2)     Status: None (Preliminary result)   Collection Time: 07/31/19  4:00 AM   Specimen: BLOOD  Result Value Ref Range Status   Specimen Description BLOOD LEFT FA  Final   Special Requests   Final    BOTTLES DRAWN AEROBIC AND ANAEROBIC Blood Culture adequate volume   Culture   Final    NO GROWTH 1 DAY Performed at [facility], [address] [place], [place] [address]    Report Status PENDING  Incomplete         Radiology Studies: DG Chest Port 1 View  Result Date: 07/30/2019 CLINICAL DATA:  Fatigue, COVID-19 exposure, fever 1 day prior EXAM: PORTABLE CHEST 1 VIEW COMPARISON:  Radiograph Dec 25, 2012, CT March 11, 2010 FINDINGS: Lung volumes are low with some increasing streaky opacities in the lung bases possibly accentuated by body habitus. No pneumothorax or effusion. Mild cardiomegaly with a calcified tortuous aorta. No acute osseous or soft tissue abnormality. Degenerative changes are present in the imaged spine and shoulders. Stable surgical clip seen in the base of the left neck. IMPRESSION: 1. Low lung volumes with some increasing streaky opacities in the lung bases, possibly reflecting atelectasis or early infection 2. Mild cardiomegaly. 3.  Aortic Atherosclerosis (ICD10-I70.0). Electronically Signed   By: Lovena Le M.D.   On: 07/30/2019 23:29        Scheduled Meds: . albuterol  2 puff Inhalation Q6H  . vitamin C  500 mg Oral Daily  . dexamethasone  6 mg Oral QHS  . insulin aspart  0-15 Units Subcutaneous TID WC  . insulin aspart  0-5 Units Subcutaneous QHS  . multivitamin with minerals  1 tablet Oral Daily  . sodium chloride flush  3 mL Intravenous Once  . warfarin  7.5 mg Oral Once  . Warfarin -  Pharmacist Dosing Inpatient   Does not apply q1800  . zinc sulfate  220 mg Oral Daily   Continuous Infusions: . sodium chloride 100 mL/hr at 08/01/19 0528  . remdesivir 100 mg in NS 100 mL       LOS: 1 day    Time spent: 30-35 minutes    Ezekiel Slocumb, DO [facility]   If 7PM-7AM, please contact night-coverage [email or web address] Password [email or web address] 08/01/2019, 9:15 AM

## 2019-08-01 NOTE — Consult Note (Signed)
[place] for Warfarin Dosing and Monitoring  Indication: VTE prophylaxis, Hx of PE  Allergies  Allergen Reactions  . Gabapentin     Rash and itching    Patient Measurements: Height: 5\' 2"  (157.5 cm) Weight: 248 lb 8 oz (112.7 kg) IBW/kg (Calculated) : 50.1   Vital Signs: Temp: 98.9 F (37.2 C) (12/26 0806) Temp Source: Oral (12/26 0806) BP: 155/63 (12/26 0806) Pulse Rate: 61 (12/26 0806)  Labs: Recent Labs    07/30/19 1702 07/31/19 0138 07/31/19 0148 08/01/19 0513  HGB 12.6  --   --  11.4*  HCT 40.9  --   --  35.0*  PLT 235  --   --  259  LABPROT  --   --  15.7* 15.3*  INR  --   --  1.3* 1.2  CREATININE 1.63* 1.75*  --  0.83  TROPONINIHS  --   --  13  --     Estimated Creatinine Clearance: 65.2 mL/min (by C-G formula based on SCr of 0.83 mg/dL).   Medical History: Past Medical History:  Diagnosis Date  . COVID-19 07/30/2019  . Diabetes mellitus without complication ([place])   . GERD (gastroesophageal reflux disease)   . H/O blood clots 2014  . Hyperlipidemia   . Hypertension     Assessment: Pharmacy consulted for warfarin dosing and monitoring in 78 yo female with PMH of PE. INR subtherapeutic on admission.  Last dose reported on 12/23.   Home Regimen: Warfarin 5mg : Mon, Wed, Fri, Sat                             Warfarin 7.5mg : Aris Everts  DATE INR DOSE 12/25 1.3  7.5 mg 12/26 1.2  Goal of Therapy:  INR 2-3 Monitor platelets by anticoagulation protocol: Yes   Plan:  12/26 INR subtherapeutic. Will order warfarin 7.5 mg x 1 again today.  F/U INR ordered with AM labs.   Noralee Space, PharmD, BCPS Clinical Pharmacist 08/01/2019 8:32 AM

## 2019-08-02 DIAGNOSIS — N179 Acute kidney failure, unspecified: Secondary | ICD-10-CM

## 2019-08-02 LAB — COMPREHENSIVE METABOLIC PANEL
ALT: 20 U/L (ref 0–44)
AST: 33 U/L (ref 15–41)
Albumin: 3.7 g/dL (ref 3.5–5.0)
Alkaline Phosphatase: 35 U/L — ABNORMAL LOW (ref 38–126)
Anion gap: 12 (ref 5–15)
BUN: 21 mg/dL (ref 8–23)
CO2: 20 mmol/L — ABNORMAL LOW (ref 22–32)
Calcium: 8.5 mg/dL — ABNORMAL LOW (ref 8.9–10.3)
Chloride: 105 mmol/L (ref 98–111)
Creatinine, Ser: 1.08 mg/dL — ABNORMAL HIGH (ref 0.44–1.00)
GFR calc Af Amer: 57 mL/min — ABNORMAL LOW (ref >60)
GFR calc non Af Amer: 49 mL/min — ABNORMAL LOW (ref >60)
Glucose, Bld: 160 mg/dL — ABNORMAL HIGH (ref 70–99)
Potassium: 3.7 mmol/L (ref 3.5–5.1)
Sodium: 137 mmol/L (ref 135–145)
Total Bilirubin: 0.7 mg/dL (ref 0.3–1.2)
Total Protein: 7.1 g/dL (ref 6.5–8.1)

## 2019-08-02 LAB — CBC WITH DIFFERENTIAL/PLATELET
Abs Immature Granulocytes: 0.06 10*3/uL (ref 0.00–0.07)
Basophils Absolute: 0 10*3/uL (ref 0.0–0.1)
Basophils Relative: 0 %
Eosinophils Absolute: 0.1 10*3/uL (ref 0.0–0.5)
Eosinophils Relative: 2 %
HCT: 37.9 % (ref 36.0–46.0)
Hemoglobin: 12.5 g/dL (ref 12.0–15.0)
Immature Granulocytes: 1 %
Lymphocytes Relative: 40 %
Lymphs Abs: 2.7 10*3/uL (ref 0.7–4.0)
MCH: 26 pg (ref 26.0–34.0)
MCHC: 33 g/dL (ref 30.0–36.0)
MCV: 79 fL — ABNORMAL LOW (ref 80.0–100.0)
Monocytes Absolute: 0.6 10*3/uL (ref 0.1–1.0)
Monocytes Relative: 8 %
Neutro Abs: 3.4 10*3/uL (ref 1.7–7.7)
Neutrophils Relative %: 49 %
Platelets: 268 10*3/uL (ref 150–400)
RBC: 4.8 MIL/uL (ref 3.87–5.11)
RDW: 15.1 % (ref 11.5–15.5)
WBC: 6.9 10*3/uL (ref 4.0–10.5)
nRBC: 0 % (ref 0.0–0.2)

## 2019-08-02 LAB — PROTIME-INR
INR: 1.2 (ref 0.8–1.2)
Prothrombin Time: 15.4 seconds — ABNORMAL HIGH (ref 11.4–15.2)

## 2019-08-02 LAB — APTT: aPTT: 68 seconds — ABNORMAL HIGH (ref 24–36)

## 2019-08-02 LAB — GLUCOSE, CAPILLARY
Glucose-Capillary: 138 mg/dL — ABNORMAL HIGH (ref 70–99)
Glucose-Capillary: 132 mg/dL — ABNORMAL HIGH (ref 70–99)
Glucose-Capillary: 133 mg/dL — ABNORMAL HIGH (ref 70–99)
Glucose-Capillary: 113 mg/dL — ABNORMAL HIGH (ref 70–99)

## 2019-08-02 LAB — FIBRIN DERIVATIVES D-DIMER (ARMC ONLY): Fibrin derivatives D-dimer (ARMC): 714.29 ng/mL (FEU) — ABNORMAL HIGH (ref 0.00–499.00)

## 2019-08-02 LAB — HEPARIN LEVEL (UNFRACTIONATED): Heparin Unfractionated: 0.45 IU/mL (ref 0.30–0.70)

## 2019-08-02 LAB — FERRITIN: Ferritin: 973 ng/mL — ABNORMAL HIGH (ref 11–307)

## 2019-08-02 LAB — C-REACTIVE PROTEIN: CRP: 1 mg/dL — ABNORMAL HIGH (ref <1.0)

## 2019-08-02 MED ORDER — ACETAMINOPHEN 650 MG RE SUPP: 325.0000 mg | Freq: Three times a day (TID) | RECTAL | Status: DC | PRN

## 2019-08-02 MED ORDER — WARFARIN SODIUM 10 MG PO TABS
10.0000 mg | ORAL_TABLET | Freq: Once | ORAL | Status: AC
  Administered 2019-08-02: 16:00:00 10 mg via ORAL
  Filled 2019-08-02: qty 1

## 2019-08-02 MED ORDER — HEPARIN (PORCINE) 25000 UT/250ML-% IV SOLN
1100.0000 [IU]/h | INTRAVENOUS | Status: DC
  Administered 2019-08-02: 13:00:00 1200 [IU]/h via INTRAVENOUS
  Administered 2019-08-03: 14:00:00 1100 [IU]/h via INTRAVENOUS
  Filled 2019-08-02 (×2): qty 250

## 2019-08-02 MED ORDER — ACETAMINOPHEN 325 MG PO TABS
650.0000 mg | ORAL_TABLET | Freq: Four times a day (QID) | ORAL | Status: DC | PRN
  Administered 2019-08-02: 16:00:00 650 mg via ORAL
  Filled 2019-08-02: qty 2

## 2019-08-02 MED ORDER — SODIUM CHLORIDE 0.9 % IV SOLN: INTRAVENOUS | Status: AC

## 2019-08-02 NOTE — Consult Note (Signed)
[place] for Heparin and Warfarin Dosing and Monitoring  Indication: VTE prophylaxis, Hx of PE  Allergies  Allergen Reactions  . Gabapentin     Rash and itching    Patient Measurements: Height: 5\' 2"  (157.5 cm) Weight: 248 lb (112.5 kg) IBW/kg (Calculated) : 50.1   Vital Signs: Temp: 100 F (37.8 C) (12/27 1957) Temp Source: Oral (12/27 1957) BP: 113/52 (12/27 1957) Pulse Rate: 61 (12/27 1957)  Labs: Recent Labs    07/31/19 0138 07/31/19 0148 08/01/19 0513 08/02/19 0639 08/02/19 1638 08/02/19 2004  HGB  --   --  11.4* 12.5  --   --   HCT  --   --  35.0* 37.9  --   --   PLT  --   --  259 268  --   --   APTT  --   --   --   --  68*  --   LABPROT  --  15.7* 15.3* 15.4*  --   --   INR  --  1.3* 1.2 1.2  --   --   HEPARINUNFRC  --   --   --   --   --  0.45  CREATININE 1.75*  --  0.83 1.08*  --   --   TROPONINIHS  --  13  --   --   --   --     Estimated Creatinine Clearance: 50.1 mL/min (A) (by C-G formula based on SCr of 1.08 mg/dL (H)).   Medical History: Past Medical History:  Diagnosis Date  . COVID-19 07/30/2019  . Diabetes mellitus without complication ([place])   . GERD (gastroesophageal reflux disease)   . H/O blood clots 2014  . Hyperlipidemia   . Hypertension     Assessment: Pharmacy consulted for warfarin dosing and monitoring in 78 yo female with PMH of PE. INR subtherapeutic on admission.  Last dose reported on 12/23. Bridge heparin with warfarin until INR is at goal.   Home Regimen: Warfarin 5mg : Mon, Wed, Fri, Sat                             Warfarin 7.5mg : Sun, Tues, Thurs  DATE INR DOSE 12/25 1.3  7.5 mg 12/26 1.2 7.5 mg 12/27 1.2  12/27@2004 : HL 0.45  Goal of Therapy:  INR 2-3 Monitor platelets by anticoagulation protocol: Yes   Plan:  Heparin: Heparin dosing weight 76.9 kg.  Currently therapeutic. Will continue heparin infusion at 1200 unit/hr. Will order heparin level in 8 hours. CBC daily.  CBC stable.   Warfarin:  12/26 INR subtherapeutic. Will order warfarin 10 mg x 1 today.  F/U INR ordered with AM labs.   Pearla Dubonnet, PharmD Clinical Pharmacist 08/02/2019 8:56 PM

## 2019-08-02 NOTE — Evaluation (Signed)
Physical Therapy Evaluation Patient Details Name: Hannah Robertson MRN: [id number] DOB: [birth year] Today's Date: 08/02/2019   History of Present Illness  Pt is a 78 year old F admitted with COVID-19 + test results following c/o cough, nausea/vomiting, dehydration and fever.  PMH includes PE on chronic anticoagulation, DM and Htn.  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  Pt in bed appearing mildly lethargic and hesitant to work with therapy at first, but A&O and eventually agreed.  She was able to perform bed mobility and stand with minimally difficulty.  Pt presented with fair to good overall UE/LE strength with shoulder AROM flexion and abduction limitations.  Hesitant to ambulate but used SPC, CGA, and appeared close to baseline with no report of SOB and with mild fatigue.  Pt presented with moderate static and dynamic balance deficits indicating fall risk.  Pt open to education and able to perform LE there ex without difficulty.  She will continue to benefit from skilled PT with focus on balance, tolerance to activity and safe functional mobility.    Follow Up Recommendations Home health PT;Supervision - Intermittent    Equipment Recommendations  None recommended by PT    Recommendations for Other Services       Precautions / Restrictions Precautions Precautions: Fall Restrictions Weight Bearing Restrictions: No      Mobility  Bed Mobility Overal bed mobility: Modified Independent             General bed mobility comments: Increased time and use of bedrail.  Transfers Overall transfer level: Needs assistance Equipment used: None Transfers: Sit to/from Stand Sit to Stand: Supervision         General transfer comment: Able to stand with min use of UE's and bed a normal height.  Ambulation/Gait Ambulation/Gait assistance: Min guard Gait Distance (Feet): 20 Feet Assistive  device: Straight cane     Gait velocity interpretation: <1.8 ft/sec, indicate of risk for recurrent falls General Gait Details: Pt hesitant to walk but able to once enouraged.  Uses SPC appropriately and did not need a rest break.  No SOB, pt stated that she just felt tired.  Stairs            Wheelchair Mobility    Modified Rankin (Stroke Patients Only)       Balance Overall balance assessment: Needs assistance Sitting-balance support: Feet supported Sitting balance-Leahy Scale: Normal     Standing balance support: Single extremity supported Standing balance-Leahy Scale: Fair Standing balance comment: Requires use of SPC for balance right now.  Functional reach: 4" bilaterally. Single Leg Stance - Right Leg: 0 Single Leg Stance - Left Leg: 0 Tandem Stance - Right Leg: 0 Tandem Stance - Left Leg: 0 Rhomberg - Eyes Opened: 10 Rhomberg - Eyes Closed: 10(Increasd a-p sway.) High level balance activites: Side stepping;Backward walking;Direction changes;Turns High Level Balance Comments: Slowed gait and reliance on SPC.             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     Hand Dominance        Extremity/Trunk Assessment   Upper Extremity Assessment Upper Extremity Assessment: Overall WFL for tasks assessed(Grip, elbow flex/ext: 4/5, shoulder strength: 3-/5 bilaterally.)    Lower Extremity Assessment Lower  Extremity Assessment: Overall WFL for tasks assessed    Cervical / Trunk Assessment Cervical / Trunk Assessment: Normal  Communication   Communication: No difficulties  Cognition Arousal/Alertness: Awake/alert Behavior During Therapy: WFL for tasks assessed/performed Overall Cognitive Status: Within Functional Limits for tasks assessed                                        General Comments      Exercises Other Exercises Other Exercises: Review of HEP ther ex: ankle pumps, heel slides, pillow squeezes, glute squeezes, hip abduction x10 with education concerning importance of frequent mobility. x8 min   Assessment/Plan    PT Assessment Patient needs continued PT services  PT Problem List Decreased strength;Decreased mobility;Decreased activity tolerance;Decreased balance;Decreased knowledge of use of DME       PT Treatment Interventions DME instruction;Therapeutic activities;Gait training;Therapeutic exercise;Patient/family education;Stair training;Balance training;Functional mobility training    PT Goals (Current goals can be found in the Care Plan section)  Acute Rehab PT Goals Patient Stated Goal: To return home and to independent level of functioning. PT Goal Formulation: With patient Time For Goal Achievement: 08/15/19 Potential to Achieve Goals: Good    Frequency Min 2X/week   Barriers to discharge        Co-evaluation               AM-PAC PT "6 Clicks" Mobility  Outcome Measure Help needed turning from your back to your side while in a flat bed without using bedrails?: None Help needed moving from lying on your back to sitting on the side of a flat bed without using bedrails?: A Little Help needed moving to and from a bed to a chair (including a wheelchair)?: A Little Help needed standing up from a chair using your arms (e.g., wheelchair or bedside chair)?: A Little Help needed to walk in hospital room?: A Little Help needed climbing 3-5  steps with a railing? : A Little 6 Click Score: 19    End of Session Equipment Utilized During Treatment: Gait belt Activity Tolerance: Patient limited by fatigue Patient left: in bed;with call bell/phone within reach;with bed alarm set Nurse Communication: Mobility status PT Visit Diagnosis: Unsteadiness on feet (R26.81);Muscle weakness (generalized) (M62.81);Difficulty in walking, not elsewhere classified (R26.2)    Time: 4944-9675 PT Time Calculation (min) (ACUTE ONLY): 23 min   Charges:   PT Evaluation $PT Eval Low Complexity: 1 Low         Roxanne Gates, PT, DPT   Roxanne Gates 08/02/2019, 11:15 AM

## 2019-08-02 NOTE — TOC Initial Note (Addendum)
Transition of Care [facility]) - Initial/Assessment Note    Patient Details  Name: Hannah Robertson MRN: [id number] Date of Birth: 31-Oct-1940  Transition of [facility]) CM/SW Contact:    Darleene Cleaver, LCSW Phone Number: 08/02/2019, 8:00 PM  Clinical Narrative:                 CSW completed assessment by reviewing patient's chart due to patient in isolation for Covid.  Patient is a 78 year old female who is alert and oriented x4.  Patient does not currently have home health set up, CSW was able to speak to her on the phone in regards to home health agency.  Patient stated she did not have any preference, CSW was able to set patient up with [facility] for home health.  CSW spoke to [place] and she accepted patient for home health PT and aide.  Expected Discharge Plan: Home w Home Health Services Barriers to Discharge: Continued Medical Work up   Patient Goals and CMS Choice Patient states their goals for this hospitalization and ongoing recovery are:: To return back home with home health. CMS Medicare.gov Compare Post Acute Care list provided to:: Patient Choice offered to / list presented to : Patient  Expected Discharge Plan and Services Expected Discharge Plan: Home w Home Health Services     Post Acute Care Choice: Home Health Living arrangements for the past 2 months: Single Family Home                           HH Arranged: PT HH Agency: [facility] Date [facility] Agency Contacted: 08/02/19 Time HH Agency Contacted: 1500 Representative spoke with at [facility] Agency: [place]  Prior Living Arrangements/Services Living arrangements for the past 2 months: Single Family Home Lives with:: Self Patient language and need for interpreter reviewed:: Yes Do you feel safe going back to the place where you live?: Yes      Need for Family Participation in Patient Care: No (Comment) Care giver support system in place?: No (comment)   Criminal Activity/Legal Involvement Pertinent to  Current Situation/Hospitalization: No - Comment as needed  Activities of Daily Living Home Assistive Devices/Equipment: CBG Meter, Eyeglasses, Cane (specify quad or straight), Dentures (specify type) ADL Screening (condition at time of admission) Patient's cognitive ability adequate to safely complete daily activities?: Yes Is the patient deaf or have difficulty hearing?: Yes Does the patient have difficulty seeing, even when wearing glasses/contacts?: No Does the patient have difficulty concentrating, remembering, or making decisions?: No Patient able to express need for assistance with ADLs?: Yes Does the patient have difficulty dressing or bathing?: No Independently performs ADLs?: Yes (appropriate for developmental age) Does the patient have difficulty walking or climbing stairs?: Yes Weakness of Legs: None Weakness of Arms/Hands: None  Permission Sought/Granted Permission sought to share information with : Family Supports Permission granted to share information with : Yes, Verbal Permission Granted  Share Information with NAME: Vee, Bahe [phone]  [phone] or Crystal, [place] Daughter   [phone]  Permission granted to share info w AGENCY: Home Health agencies        Emotional Assessment Appearance:: Appears stated age   Affect (typically observed): Accepting, Calm, Appropriate Orientation: : Oriented to Self, Oriented to Place, Oriented to  Time, Oriented to Situation Alcohol / Substance Use: Not Applicable Psych Involvement: No (comment)  Admission diagnosis:  Dehydration [E86.0] AKI (acute kidney injury) (HCC) [N17.9] Fever, unspecified fever cause [R50.9] Pneumonia due  to COVID-19 virus [U07.1, J12.89] COVID-19 [U07.1] Patient Active Problem List   Diagnosis Date Noted  . AKI (acute kidney injury) ([place]) 07/31/2019  . Viral gastroenteritis 07/31/2019  . Dehydration 07/31/2019  . Insulin dependent type 2 diabetes mellitus ([place]) 07/31/2019  . Essential  hypertension 07/31/2019  . Acquired hypothyroidism 07/31/2019  . Chronic anticoagulation 07/31/2019  . Hypotension due to hypovolemia 07/31/2019  . Pneumonia due to COVID-19 virus 07/30/2019  . Right flank pain 07/09/2014   PCP:  Care, [place] [facility]:   [facility] [address] [place]) [place] [address] Phone: [phone] Fax: [phone]     Social Determinants of Health (SDOH) Interventions    Readmission Risk Interventions No flowsheet data found.

## 2019-08-02 NOTE — Consult Note (Signed)
[place] for Warfarin Dosing and Monitoring  Indication: VTE prophylaxis, Hx of PE  Allergies  Allergen Reactions  . Gabapentin     Rash and itching    Patient Measurements: Height: 5\' 2"  (157.5 cm) Weight: 248 lb (112.5 kg) IBW/kg (Calculated) : 50.1   Vital Signs: Temp: 99.8 F (37.7 C) (12/27 0812) Temp Source: Oral (12/27 0812) BP: 93/46 (12/27 0812) Pulse Rate: 57 (12/27 0812)  Labs: Recent Labs    07/30/19 1702 07/31/19 0138 07/31/19 0148 08/01/19 0513 08/02/19 0639  HGB 12.6  --   --  11.4* 12.5  HCT 40.9  --   --  35.0* 37.9  PLT 235  --   --  259 268  LABPROT  --   --  15.7* 15.3* 15.4*  INR  --   --  1.3* 1.2 1.2  CREATININE 1.63* 1.75*  --  0.83 1.08*  TROPONINIHS  --   --  13  --   --     Estimated Creatinine Clearance: 50.1 mL/min (A) (by C-G formula based on SCr of 1.08 mg/dL (H)).   Medical History: Past Medical History:  Diagnosis Date  . COVID-19 07/30/2019  . Diabetes mellitus without complication ([place])   . GERD (gastroesophageal reflux disease)   . H/O blood clots 2014  . Hyperlipidemia   . Hypertension     Assessment: Pharmacy consulted for warfarin dosing and monitoring in 78 yo female with PMH of PE. INR subtherapeutic on admission.  Last dose reported on 12/23.   Home Regimen: Warfarin 5mg : Mon, Wed, Fri, Sat                             Warfarin 7.5mg : Sun, Tues, Thurs  DATE INR DOSE 12/25 1.3  7.5 mg 12/26 1.2 7.5 mg 12/27 1.2  Goal of Therapy:  INR 2-3 Monitor platelets by anticoagulation protocol: Yes   Plan:  12/26 INR subtherapeutic. Will order warfarin 10 mg x 1 today.  F/U INR ordered with AM labs.   Noralee Space, PharmD, BCPS Clinical Pharmacist 08/02/2019 10:29 AM

## 2019-08-02 NOTE — Consult Note (Signed)
[facility] for Heparin and Warfarin Dosing and Monitoring  Indication: VTE prophylaxis, Hx of PE  Allergies  Allergen Reactions  . Gabapentin     Rash and itching    Patient Measurements: Height: 5\' 2"  (157.5 cm) Weight: 248 lb (112.5 kg) IBW/kg (Calculated) : 50.1   Vital Signs: Temp: 99.8 F (37.7 C) (12/27 0812) Temp Source: Oral (12/27 0812) BP: 93/46 (12/27 0812) Pulse Rate: 57 (12/27 0812)  Labs: Recent Labs    07/30/19 1702 07/31/19 0138 07/31/19 0148 08/01/19 0513 08/02/19 0639  HGB 12.6  --   --  11.4* 12.5  HCT 40.9  --   --  35.0* 37.9  PLT 235  --   --  259 268  LABPROT  --   --  15.7* 15.3* 15.4*  INR  --   --  1.3* 1.2 1.2  CREATININE 1.63* 1.75*  --  0.83 1.08*  TROPONINIHS  --   --  13  --   --     Estimated Creatinine Clearance: 50.1 mL/min (A) (by C-G formula based on SCr of 1.08 mg/dL (H)).   Medical History: Past Medical History:  Diagnosis Date  . COVID-19 07/30/2019  . Diabetes mellitus without complication ([place])   . GERD (gastroesophageal reflux disease)   . H/O blood clots 2014  . Hyperlipidemia   . Hypertension     Assessment: Pharmacy consulted for warfarin dosing and monitoring in 78 yo female with PMH of PE. INR subtherapeutic on admission.  Last dose reported on 12/23. Bridge heparin with warfarin until INR is at goal.   Home Regimen: Warfarin 5mg : Mon, Wed, Fri, Sat                             Warfarin 7.5mg : Sun, Tues, Thurs  DATE INR DOSE 12/25 1.3  7.5 mg 12/26 1.2 7.5 mg 12/27 1.2  Goal of Therapy:  INR 2-3 Monitor platelets by anticoagulation protocol: Yes   Plan:  Heparin: Heparin dosing weight 76.9 kg.  No bolus. Will start heparin infusion at 1200 unit/hr. Will order heparin level in 8 hours. CBC daily. CBC stable.   Warfarin:  12/26 INR subtherapeutic. Will order warfarin 10 mg x 1 today.  F/U INR ordered with AM labs.   Oswald Hillock, PharmD, BCPS Clinical  Pharmacist 08/02/2019 11:23 AM

## 2019-08-02 NOTE — Progress Notes (Signed)
PROGRESS NOTE    Hannah Robertson  AVW:[id number] DOB: 1940/11/07 DOA: 07/30/2019  PCP: [facility]    LOS - 2   Brief Narrative:  78 y.o. female with medical history of insulin-dependent type 2 diabetes, hypertension on chronic anticoagulation with Coumadin due to history of PE who presented to the ED from urgent care with diarrhea, cough and generalized weakness, nausea without vomiting, very poor PO intake. Tested positive for Covid.  Sudden onset of symptoms 2 days prior.  In the ED, febrile 100.9, hypotensive 95/55.  Labs notable for creatinine 1.63 (from baseline 0.8).  Chest x-ray showed streaky opacities, representing early infection/atelectasis.  Admitted to hospitalist service for further management.  IV fluids for AKI, remdesivir, vitamins.    Subjective 12/27: No acute issues overnight or this morning.  Assessment & Plan:   Principal Problem:   AKI (acute kidney injury) ([place]) Active Problems:   Pneumonia due to COVID-19 virus   Viral gastroenteritis   Dehydration   Insulin dependent type 2 diabetes mellitus ([place])   Essential hypertension   Acquired hypothyroidism   Chronic anticoagulation   Hypotension due to hypovolemia    AKI (acute kidney injury) ([place]) -fluctuating -Creatinine went up to 1.08 from 0.83 --Secondary to hydration from hypovolemia from acute gastroenteritis related to COVID-19 --May need to resume gentle IV hydration for short duration --monitor BMP --avoid nephrotoxins    Pneumonia due to COVID-19 virus with hypoxia --Chest x-ray showing streaky opacities at bases reflecting atelectasis or early infection. --Continue remdesivir per pharmacy, vitamins --Decadron started initially, stopped since no hypoxia / oxygen requirement --supplemental oxygen if needed to keep sats over 90%  Viral gastroenteritis related to COVID-19 infection --Fluctuating  -Continue supportive care   --IV antiemetics as needed --Cardiac diet now    Hypotension due to hypovolemia --Systolic blood pressure still intermittently running low --Resumed enteral IV hydration untiil improved with close monitoring for worsening respiratory symptoms in the setting of Covid 19 pneumonia. -- Hold home antihypertensives --Continue to monitor closely    Insulin dependent type 2 diabetes mellitus ([place]): Stable blood glucose --supplemental insulin only    Acquired hypothyroidism --continue levothyroxine    Chronic anticoagulation secondary to history of PE --Still subtherapeutic INR ; check daily INR --continue warfarin per pharmacy -Also due to her chronic history of PE and current Covid infection, will start patient heparin drip bridging per pharmacy   DVT prophylaxis: on warfarin   Code Status: Full Code  Family Communication: none at bedside  Disposition Plan:  Expect may be able to d/c home once INR starts improving, pending home health care PT and aide arrangement per case manager.   Consultants:   None  Procedures:   None  Antimicrobials:   None    Objective: Vitals:   08/01/19 1609 08/01/19 1946 08/02/19 0400 08/02/19 0812  BP: 139/61 113/69 130/64 (!) 93/46  Pulse: 60 (!) 58 (!) 57 (!) 57  Resp: 19 20 18 18   Temp: 99 F (37.2 C) 99.6 F (37.6 C) 99.2 F (37.3 C) 99.8 F (37.7 C)  TempSrc: Oral Oral Oral Oral  SpO2: 99% 95% 91% 100%  Weight:   112.5 kg   Height:        Intake/Output Summary (Last 24 hours) at 08/02/2019 1508 Last data filed at 08/02/2019 1249 Gross per 24 hour  Intake --  Output 600 ml  Net -600 ml   Filed Weights   07/31/19 1519 08/01/19 0458 08/02/19 0400  Weight: 110.1 kg 112.7 kg  112.5 kg    Examination:  General exam: awake, alert, no acute distress, obese HEENT: moist mucus membranes, hearing grossly normal  Respiratory system: clear to auscultation bilaterally, no wheezes, rales or rhonchi, normal respiratory effort. Cardiovascular system: normal S1/S2, RRR, no JVD,  murmurs, rubs, gallops, no pedal edema.   Gastrointestinal system: soft, non-tender, non-distended abdomen, no guarding or rebound tenderness, normal bowel sounds. Central nervous system: alert and oriented x3. no gross focal neurologic deficits, normal speech Extremities: moves all, no edema, normal tone Skin: dry, intact, normal temperature Psychiatry: normal mood, congruent affect, judgement and insight appear normal    Data Reviewed: I have personally reviewed following labs and imaging studies  CBC: Recent Labs  Lab 07/30/19 1702 08/01/19 0513 08/02/19 0639  WBC 6.8 4.3 6.9  NEUTROABS  --  1.8 3.4  HGB 12.6 11.4* 12.5  HCT 40.9 35.0* 37.9  MCV 84.7 80.8 79.0*  PLT 235 259 626   Basic Metabolic Panel: Recent Labs  Lab 07/30/19 1702 07/31/19 0138 08/01/19 0513 08/02/19 0639  NA 136  --  138 137  K 3.3*  --  3.6 3.7  CL 103  --  107 105  CO2 20*  --  20* 20*  GLUCOSE 183*  --  185* 160*  BUN 23  --  23 21  CREATININE 1.63* 1.75* 0.83 1.08*  CALCIUM 9.3  --  8.3* 8.5*   GFR: Estimated Creatinine Clearance: 50.1 mL/min (A) (by C-G formula based on SCr of 1.08 mg/dL (H)). Liver Function Tests: Recent Labs  Lab 07/30/19 1702 08/01/19 0513 08/02/19 0639  AST 28 26 33  ALT 18 17 20   ALKPHOS 38 34* 35*  BILITOT 0.8 0.5 0.7  PROT 7.6 6.8 7.1  ALBUMIN 4.2 3.5 3.7   Recent Labs  Lab 07/30/19 1702  LIPASE 49   No results for input(s): AMMONIA in the last 168 hours. Coagulation Profile: Recent Labs  Lab 07/31/19 0148 08/01/19 0513 08/02/19 0639  INR 1.3* 1.2 1.2   Cardiac Enzymes: No results for input(s): CKTOTAL, CKMB, CKMBINDEX, TROPONINI in the last 168 hours. BNP (last 3 results) No results for input(s): PROBNP in the last 8760 hours. HbA1C: Recent Labs    07/31/19 1548  HGBA1C 7.8*   CBG: Recent Labs  Lab 08/01/19 1226 08/01/19 1711 08/01/19 2113 08/02/19 0812 08/02/19 1208  GLUCAP 165* 188* 168* 138* 132*   Lipid Profile: Recent  Labs    07/31/19 0148  TRIG 105   Thyroid Function Tests: No results for input(s): TSH, T4TOTAL, FREET4, T3FREE, THYROIDAB in the last 72 hours. Anemia Panel: Recent Labs    07/31/19 0148 08/02/19 0639  FERRITIN 600* 973*   Sepsis Labs: Recent Labs  Lab 07/30/19 2356 07/31/19 0150  PROCALCITON <0.10  --   LATICACIDVEN  --  1.0    Recent Results (from the past 240 hour(s))  SARS Coronavirus 2 Ag (30 min TAT) - Nasal Swab (BD Veritor Kit)     Status: Abnormal   Collection Time: 07/30/19  3:57 PM   Specimen: Nasal Swab (BD Veritor Kit)  Result Value Ref Range Status   SARS Coronavirus 2 Ag POSITIVE (A) NEGATIVE Final    Comment: RESULT CALLED TO, READ BACK BY AND VERIFIED WITH: BRYAN GRAY 07/30/19 1615 [place] (NOTE) SARS-CoV-2 antigen PRESENT. Positive results indicate the presence of viral antigens, but clinical correlation with patient history and other diagnostic information is necessary to determine patient infection status.  Positive results do not rule out bacterial infection or  co-infection  with other viruses. False positive results are rare but can occur, and confirmatory RT-PCR testing may be appropriate in some circumstances. The expected result is Negative. Fact Sheet for Patients: PodPark.tn Fact Sheet for Providers: GiftContent.is  This test is not yet approved or cleared by the [place] FDA and  has been authorized for detection and/or diagnosis of SARS-CoV-2 by FDA under an Emergency Use Authorization (EUA).  This EUA will remain in effect (meaning this test can be used) for the duration of  the COVID-19 declaration u nder Section 564(b)(1) of the Act, 21 U.S.C. section 360bbb-3(b)(1), unless the authorization is terminated or revoked sooner. Performed at [facility], [address] [place], [place] [address]   Urine culture     Status: None   Collection Time: 07/30/19 11:56  PM   Specimen: Urine, Clean Catch  Result Value Ref Range Status   Specimen Description   Final    URINE, CLEAN CATCH Performed at [facility], [address], [place], [place] [address]    Special Requests   Final    Normal Performed at [facility], [address], [place], [place] [address]    Culture   Final    NO GROWTH Performed at [facility], [place] [address], [place], [place] [address]    Report Status 08/01/2019 FINAL  Final  Blood Culture (routine x 2)     Status: None (Preliminary result)   Collection Time: 07/31/19  1:48 AM   Specimen: Left Antecubital; Blood  Result Value Ref Range Status   Specimen Description LEFT ANTECUBITAL  Final   Special Requests   Final    BOTTLES DRAWN AEROBIC AND ANAEROBIC Blood Culture adequate volume   Culture   Final    NO GROWTH 2 DAYS Performed at [facility], [address], [place], [place] [address]    Report Status PENDING  Incomplete  Blood Culture (routine x 2)     Status: None (Preliminary result)   Collection Time: 07/31/19  4:00 AM   Specimen: BLOOD  Result Value Ref Range Status   Specimen Description BLOOD LEFT FA  Final   Special Requests   Final    BOTTLES DRAWN AEROBIC AND ANAEROBIC Blood Culture adequate volume   Culture   Final    NO GROWTH 2 DAYS Performed at [facility], [address], [place],  [address]    Report Status PENDING  Incomplete         Radiology Studies: No results found.      Scheduled Meds: . albuterol  2 puff Inhalation Q6H  . vitamin C  500 mg Oral Daily  . insulin aspart  0-15 Units Subcutaneous TID WC  . insulin aspart  0-5 Units Subcutaneous QHS  . multivitamin with minerals  1 tablet Oral Daily  . sodium chloride flush  3 mL Intravenous Once  . warfarin  10 mg Oral Once  . Warfarin - Pharmacist Dosing Inpatient   Does not apply q1800  . zinc sulfate  220 mg Oral Daily   Continuous Infusions: . sodium chloride 50  mL/hr at 08/02/19 0849  . heparin 1,200 Units/hr (08/02/19 1232)  . remdesivir 100 mg in NS 100 mL Stopped (08/02/19 0913)     LOS: 2 days    Time spent: 30-35 minutes    Thornell Mule, DO [facility]   If 7PM-7AM, please contact night-coverage [email or web address] Password [email or web address] 08/02/2019, 3:08 PM

## 2019-08-03 LAB — COMPREHENSIVE METABOLIC PANEL
ALT: 21 U/L (ref 0–44)
AST: 32 U/L (ref 15–41)
Albumin: 3.5 g/dL (ref 3.5–5.0)
Alkaline Phosphatase: 35 U/L — ABNORMAL LOW (ref 38–126)
Anion gap: 11 (ref 5–15)
BUN: 16 mg/dL (ref 8–23)
CO2: 22 mmol/L (ref 22–32)
Calcium: 8.4 mg/dL — ABNORMAL LOW (ref 8.9–10.3)
Chloride: 105 mmol/L (ref 98–111)
Creatinine, Ser: 0.99 mg/dL (ref 0.44–1.00)
GFR calc Af Amer: >60 mL/min (ref >60)
GFR calc non Af Amer: 54 mL/min — ABNORMAL LOW (ref >60)
Glucose, Bld: 163 mg/dL — ABNORMAL HIGH (ref 70–99)
Potassium: 3.5 mmol/L (ref 3.5–5.1)
Sodium: 138 mmol/L (ref 135–145)
Total Bilirubin: 0.9 mg/dL (ref 0.3–1.2)
Total Protein: 6.8 g/dL (ref 6.5–8.1)

## 2019-08-03 LAB — GLUCOSE, CAPILLARY
Glucose-Capillary: 138 mg/dL — ABNORMAL HIGH (ref 70–99)
Glucose-Capillary: 133 mg/dL — ABNORMAL HIGH (ref 70–99)
Glucose-Capillary: 110 mg/dL — ABNORMAL HIGH (ref 70–99)
Glucose-Capillary: 131 mg/dL — ABNORMAL HIGH (ref 70–99)

## 2019-08-03 LAB — PROTIME-INR
INR: 1.5 — ABNORMAL HIGH (ref 0.8–1.2)
Prothrombin Time: 18.4 seconds — ABNORMAL HIGH (ref 11.4–15.2)

## 2019-08-03 LAB — CBC
HCT: 36.5 % (ref 36.0–46.0)
Hemoglobin: 11.7 g/dL — ABNORMAL LOW (ref 12.0–15.0)
MCH: 26.1 pg (ref 26.0–34.0)
MCHC: 32.1 g/dL (ref 30.0–36.0)
MCV: 81.5 fL (ref 80.0–100.0)
Platelets: 251 10*3/uL (ref 150–400)
RBC: 4.48 MIL/uL (ref 3.87–5.11)
RDW: 15 % (ref 11.5–15.5)
WBC: 5.3 10*3/uL (ref 4.0–10.5)
nRBC: 0 % (ref 0.0–0.2)

## 2019-08-03 LAB — C-REACTIVE PROTEIN: CRP: 4.4 mg/dL — ABNORMAL HIGH (ref <1.0)

## 2019-08-03 LAB — HEPARIN LEVEL (UNFRACTIONATED)
Heparin Unfractionated: 0.78 IU/mL — ABNORMAL HIGH (ref 0.30–0.70)
Heparin Unfractionated: 0.99 IU/mL — ABNORMAL HIGH (ref 0.30–0.70)

## 2019-08-03 LAB — FIBRIN DERIVATIVES D-DIMER (ARMC ONLY): Fibrin derivatives D-dimer (ARMC): 583.14 ng/mL (FEU) — ABNORMAL HIGH (ref 0.00–499.00)

## 2019-08-03 LAB — FERRITIN: Ferritin: 892 ng/mL — ABNORMAL HIGH (ref 11–307)

## 2019-08-03 MED ORDER — HEPARIN (PORCINE) 25000 UT/250ML-% IV SOLN
850.0000 [IU]/h | INTRAVENOUS | Status: DC
  Administered 2019-08-03: 20:00:00 850 [IU]/h via INTRAVENOUS

## 2019-08-03 MED ORDER — LEVOTHYROXINE SODIUM 112 MCG PO TABS
112.0000 ug | ORAL_TABLET | Freq: Every day | ORAL | Status: DC
  Administered 2019-08-04: 05:00:00 112 ug via ORAL
  Filled 2019-08-03 (×2): qty 1

## 2019-08-03 MED ORDER — MAGNESIUM HYDROXIDE 400 MG/5ML PO SUSP: 15.0000 mL | Freq: Every day | ORAL | Status: DC | PRN

## 2019-08-03 MED ORDER — PSYLLIUM 95 % PO PACK
1.0000 | PACK | Freq: Every day | ORAL | Status: DC | PRN
  Filled 2019-08-03: qty 1

## 2019-08-03 MED ORDER — WARFARIN SODIUM 7.5 MG PO TABS
7.5000 mg | ORAL_TABLET | Freq: Once | ORAL | Status: AC
  Administered 2019-08-03: 20:00:00 7.5 mg via ORAL
  Filled 2019-08-03: qty 1

## 2019-08-03 NOTE — Progress Notes (Signed)
Physical Therapy Treatment Patient Details Name: Hannah Robertson MRN: [id number] DOB: [birth year] Today's Date: 08/03/2019    History of Present Illness Pt is a 78 year old F admitted with COVID-19 + test results following c/o cough, nausea/vomiting, dehydration and fever.  PMH includes PE on chronic anticoagulation, DM and Htn.    PT Comments    Pt able to progress to seated there ex and improved gait mechanics today.  Pt still appears mildly unsteady on feet but is able to navigate obstacles and support herself with her SPC.  She felt confident in ambulating 30 ft in room with supervision.  She did rely on SPC to stand from bedside but was able to stand on first try.  Pt presented with WNL O2 at rest and 100% O2 sat during activity.  Pt still reporting some fatigue but no SOB.  She was able to complete all there ex with supervision and VC's for form without difficulty.  Pt will continue to benefit from skilled PT to address tolerance to activity, strength and balance.   Follow Up Recommendations  Home health PT;Supervision - Intermittent     Equipment Recommendations  None recommended by PT    Recommendations for Other Services       Precautions / Restrictions Precautions Precautions: Fall Restrictions Weight Bearing Restrictions: No    Mobility  Bed Mobility Overal bed mobility: Modified Independent             General bed mobility comments: Increased time and use of bedrail.  Transfers Overall transfer level: Needs assistance Equipment used: None Transfers: Sit to/from Stand Sit to Stand: Supervision         General transfer comment: Able to stand with min use of UE's and bed a normal height.  Supported with [place] minimally.  Ambulation/Gait Ambulation/Gait assistance: Supervision Gait Distance (Feet): 30 Feet Assistive device: Straight cane     Gait velocity interpretation: 1.31 - 2.62 ft/sec, indicative of limited community ambulator General Gait Details:  Pt requested that PT not assist her in ambulation and she was able to navigate room safely. Low to moderate foot clearance and step length, increased lateral shift with swing phase.   Stairs             Wheelchair Mobility    Modified Rankin (Stroke Patients Only)       Balance Overall balance assessment: Needs assistance Sitting-balance support: Feet supported Sitting balance-Leahy Scale: Normal     Standing balance support: Single extremity supported Standing balance-Leahy Scale: Fair Standing balance comment: Requires use of SPC for balance.                            Cognition Arousal/Alertness: Awake/alert Behavior During Therapy: WFL for tasks assessed/performed Overall Cognitive Status: Within Functional Limits for tasks assessed                                        Exercises Other Exercises Other Exercises: Seated march, heel raises/toe raises x20, pillow squeeze, hip abduction x15 Other Exercises: time to monitor vitals x4 min    General Comments        Pertinent Vitals/Pain Pain Assessment: No/denies pain    Home Living                      Prior Function  PT Goals (current goals can now be found in the care plan section) Acute Rehab PT Goals Patient Stated Goal: To return home and to independent level of functioning. PT Goal Formulation: With patient Time For Goal Achievement: 08/15/19 Potential to Achieve Goals: Good Progress towards PT goals: Progressing toward goals    Frequency    Min 2X/week      PT Plan      Co-evaluation              AM-PAC PT "6 Clicks" Mobility   Outcome Measure  Help needed turning from your back to your side while in a flat bed without using bedrails?: None Help needed moving from lying on your back to sitting on the side of a flat bed without using bedrails?: A Little Help needed moving to and from a bed to a chair (including a wheelchair)?: A  Little Help needed standing up from a chair using your arms (e.g., wheelchair or bedside chair)?: A Little Help needed to walk in hospital room?: A Little Help needed climbing 3-5 steps with a railing? : A Little 6 Click Score: 19    End of Session Equipment Utilized During Treatment: Gait belt Activity Tolerance: Patient limited by fatigue Patient left: in bed;with call bell/phone within reach;with bed alarm set Nurse Communication: Mobility status PT Visit Diagnosis: Unsteadiness on feet (R26.81);Muscle weakness (generalized) (M62.81);Difficulty in walking, not elsewhere classified (R26.2)     Time: 5027-7412 PT Time Calculation (min) (ACUTE ONLY): 25 min  Charges:  $Therapeutic Exercise: 8-22 mins $Therapeutic Activity: 8-22 mins                     Glenetta Hew, PT, DPT    Glenetta Hew 08/03/2019, 3:06 PM

## 2019-08-03 NOTE — Consult Note (Signed)
Hannah Robertson for Heparin and Warfarin Dosing and Monitoring  Indication: VTE prophylaxis, Hx of PE  Allergies  Allergen Reactions  . Gabapentin     Rash and itching    Patient Measurements: Height: 5\' 2"  (157.5 cm) Weight: 250 lb 1.6 oz (113.4 kg) IBW/kg (Calculated) : 50.1   Vital Signs: Temp: 99.4 F (37.4 C) (12/28 0802) Temp Source: Oral (12/28 0802) BP: 138/74 (12/28 0802) Pulse Rate: 60 (12/28 0802)  Labs: Recent Labs    08/01/19 0513 08/02/19 0639 08/02/19 1638 08/02/19 2004 08/03/19 0724  HGB 11.4* 12.5  --   --  11.7*  HCT 35.0* 37.9  --   --  36.5  PLT 259 268  --   --  251  APTT  --   --  68*  --   --   LABPROT 15.3* 15.4*  --   --  18.4*  INR 1.2 1.2  --   --  1.5*  HEPARINUNFRC  --   --   --  0.45 0.78*  CREATININE 0.83 1.08*  --   --   --     Estimated Creatinine Clearance: 50.3 mL/min (A) (by C-G formula based on SCr of 1.08 mg/dL (H)).   Medical History: Past Medical History:  Diagnosis Date  . COVID-19 07/30/2019  . Diabetes mellitus without complication ([place])   . GERD (gastroesophageal reflux disease)   . H/O blood clots 2014  . Hyperlipidemia   . Hypertension     Assessment: Pharmacy consulted for warfarin dosing and monitoring in 78 yo female with PMH of PE. INR subtherapeutic on admission.  Last dose reported on 12/23. Bridge heparin with warfarin until INR is at goal.   Home Regimen: Warfarin 5mg : Mon, Wed, Fri, Sat                             Warfarin 7.5mg : Sun, Tues, Thurs  DATE INR DOSE 12/25 1.3  7.5 mg 12/26 1.2 7.5 mg 12/27 1.2       10 mg 12/28   1.5         12/27@2004 : HL 0.45 12/28@0724   HL 0.78 (1200 units/hr) (single line infiltrated - Heparin paused ~ 1/2 hour while new line obtained)  Goal of Therapy:  INR 2-3 Monitor platelets by anticoagulation protocol: Yes   Plan:  Heparin: Heparin dosing weight 76.9 kg.  Currently supratherapeutic.   Will reduce heparin infusion  to 1100 unit/hr.   Will order heparin level in 8 hours. CBC daily. CBC stable.   Warfarin:  12/28 INR subtherapeutic, but trending up.   Will order warfarin 7.5 mg x 1 today.  F/U INR ordered with AM labs.   Lu Duffel, PharmD, BCPS Clinical Pharmacist 08/03/2019 8:38 AM

## 2019-08-03 NOTE — Care Management Important Message (Signed)
Important Message  Patient Details  Name: Hannah Robertson MRN: [id number] Date of Birth: 05-19-1941   Medicare Important Message Given:  Yes  Reviewed verbally over room phone with patient due to isolation status.  Patient requested copy be brought to her room.  Copy of Medicare IM given to unit secretary to bring into room next time someone needs to go in.     Dannette Barbara 08/03/2019, 1:39 PM

## 2019-08-03 NOTE — Progress Notes (Signed)
PROGRESS NOTE    Hannah Robertson  DPO:[id number] DOB: 23-Jul-1941 DOA: 07/30/2019  PCP: [facility]    LOS - 3   Brief Narrative:  78 y.o. female with medical history of insulin-dependent type 2 diabetes, hypertension on chronic anticoagulation with Coumadin due to history of PE who presented to the ED from urgent care with diarrhea, cough and generalized weakness, nausea without vomiting, very poor PO intake. Tested positive for Covid.  Sudden onset of symptoms 2 days prior.  In the ED, febrile 100.9, hypotensive 95/55.  Labs notable for creatinine 1.63 (from baseline 0.8).  Chest x-ray showed streaky opacities, representing early infection/atelectasis.  Admitted to hospitalist service for further management.  IV fluids for AKI, remdesivir, vitamins.    Subjective 12/27: No acute issues overnight or this morning.  Assessment & Plan:   Principal Problem:   AKI (acute kidney injury) ([place]) Active Problems:   Pneumonia due to COVID-19 virus   Viral gastroenteritis   Dehydration   Insulin dependent type 2 diabetes mellitus ([place])   Essential hypertension   Acquired hypothyroidism   Chronic anticoagulation   Hypotension due to hypovolemia    AKI (acute kidney injury) ([place]) -fluctuating -Creatinine improving --Secondary to dehydration from hypovolemia from acute gastroenteritis related to COVID-19 --S/p gentle IV hydration for short duration --monitor BMP --avoid nephrotoxins    Pneumonia due to COVID-19 virus with hypoxia --Chest x-ray showing streaky opacities at bases reflecting atelectasis or early infection. --Continue remdesivir per pharmacy, vitamins; course to be completed on 08/04/2019 --Decadron started initially, stopped since no hypoxia / oxygen requirement --supplemental oxygen if needed to keep sats over 90% -O2 sats check while ambulation  Viral gastroenteritis related to COVID-19 infection --Fluctuating  -Continue supportive care   --IV antiemetics  as needed --Cardiac diet now  -[place] p.o. hydration    Hypotension due to hypovolemia --Systolic blood slowly improving --Status post IV hydration  - respiratory symptoms in the setting of Covid 19 pneumonia. -- Hold home antihypertensives --Continue to monitor closely; may resume when is stable    Insulin dependent type 2 diabetes mellitus ([place]): Stable blood glucose --supplemental insulin only    Acquired hypothyroidism --continue levothyroxine    Chronic anticoagulation secondary to history of PE-currently subtherapeutic --Slowly improving towards goal; check daily INR -Patient has history of pulmonary embolism --continue warfarin per pharmacy -Also due to her chronic history of PE and current Covid infection,  heparin drip bridging per pharmacy -Patient pharmacy help  DVT prophylaxis: on warfarin   Code Status: Full Code  Family Communication: none at bedside  Disposition Plan:  Expect may be able to d/c home once INR starts improving, pending home health care PT and aide arrangement per case manager.   Consultants:   None  Procedures:   None  Antimicrobials:   None    Objective: Vitals:   08/02/19 1730 08/02/19 1957 08/03/19 0427 08/03/19 0802  BP:  (!) 113/52 (!) 126/58 138/74  Pulse:  61 (!) 54 60  Resp:      Temp: 99.5 F (37.5 C) 100 F (37.8 C) 98.8 F (37.1 C) 99.4 F (37.4 C)  TempSrc:  Oral Oral Oral  SpO2:  97% 93% 98%  Weight:   113.4 kg   Height:        Intake/Output Summary (Last 24 hours) at 08/03/2019 1418 Last data filed at 08/03/2019 0715 Gross per 24 hour  Intake --  Output 950 ml  Net -950 ml   Filed Weights   08/01/19  1287 08/02/19 0400 08/03/19 0427  Weight: 112.7 kg 112.5 kg 113.4 kg    Examination:  General exam: awake, alert, no acute distress, obese HEENT: moist mucus membranes, hearing grossly normal  Respiratory system: clear to auscultation bilaterally, no wheezes, rales or rhonchi, normal respiratory  effort. Cardiovascular system: normal S1/S2, RRR, no JVD, murmurs, rubs, gallops, no pedal edema.   Gastrointestinal system: soft, non-tender, non-distended abdomen, no guarding or rebound tenderness, normal bowel sounds. Central nervous system: alert and oriented x3. no gross focal neurologic deficits, normal speech Extremities: moves all, no edema, normal tone Skin: dry, intact, normal temperature Psychiatry: normal mood, congruent affect, judgement and insight appear normal    Data Reviewed: I have personally reviewed following labs and imaging studies  CBC: Recent Labs  Lab 07/30/19 1702 08/01/19 0513 08/02/19 0639 08/03/19 0724  WBC 6.8 4.3 6.9 5.3  NEUTROABS  --  1.8 3.4  --   HGB 12.6 11.4* 12.5 11.7*  HCT 40.9 35.0* 37.9 36.5  MCV 84.7 80.8 79.0* 81.5  PLT 235 259 268 867   Basic Metabolic Panel: Recent Labs  Lab 07/30/19 1702 07/31/19 0138 08/01/19 0513 08/02/19 0639 08/03/19 0724  NA 136  --  138 137 138  K 3.3*  --  3.6 3.7 3.5  CL 103  --  107 105 105  CO2 20*  --  20* 20* 22  GLUCOSE 183*  --  185* 160* 163*  BUN 23  --  _0 CREATININE 1.63* 1.75* 0.83 1.08* 0.99  CALCIUM 9.3  --  8.3* 8.5* 8.4*   GFR: Estimated Creatinine Clearance: 54.8 mL/min (by C-G formula based on SCr of 0.99 mg/dL). Liver Function Tests: Recent Labs  Lab 07/30/19 1702 08/01/19 0513 08/02/19 0639 08/03/19 0724  AST 28 26 33 32  ALT _1 ALKPHOS 38 34* 35* 35*  BILITOT 0.8 0.5 0.7 0.9  PROT 7.6 6.8 7.1 6.8  ALBUMIN 4.2 3.5 3.7 3.5   Recent Labs  Lab 07/30/19 1702  LIPASE 49   No results for input(s): AMMONIA in the last 168 hours. Coagulation Profile: Recent Labs  Lab 07/31/19 0148 08/01/19 0513 08/02/19 0639 08/03/19 0724  INR 1.3* 1.2 1.2 1.5*   Cardiac Enzymes: No results for input(s): CKTOTAL, CKMB, CKMBINDEX, TROPONINI in the last 168 hours. BNP (last 3 results) No results for input(s): PROBNP in the last 8760 hours. HbA1C: Recent Labs     07/31/19 1548  HGBA1C 7.8*   CBG: Recent Labs  Lab 08/02/19 1208 08/02/19 1652 08/02/19 1959 08/03/19 0755 08/03/19 1203  GLUCAP 132* 133* 113* 138* 133*   Lipid Profile: No results for input(s): CHOL, HDL, LDLCALC, TRIG, CHOLHDL, LDLDIRECT in the last 72 hours. Thyroid Function Tests: No results for input(s): TSH, T4TOTAL, FREET4, T3FREE, THYROIDAB in the last 72 hours. Anemia Panel: Recent Labs    08/02/19 0639 08/03/19 0724  FERRITIN 973* 892*   Sepsis Labs: Recent Labs  Lab 07/30/19 2356 07/31/19 0150  PROCALCITON <0.10  --   LATICACIDVEN  --  1.0    Recent Results (from the past 240 hour(s))  SARS Coronavirus 2 Ag (30 min TAT) - Nasal Swab (BD Veritor Kit)     Status: Abnormal   Collection Time: 07/30/19  3:57 PM   Specimen: Nasal Swab (BD Veritor Kit)  Result Value Ref Range Status   SARS Coronavirus 2 Ag POSITIVE (A) NEGATIVE Final    Comment: RESULT CALLED TO, READ BACK BY AND VERIFIED WITH: BRYAN  GRAY 07/30/19 1615 [place] (NOTE) SARS-CoV-2 antigen PRESENT. Positive results indicate the presence of viral antigens, but clinical correlation with patient history and other diagnostic information is necessary to determine patient infection status.  Positive results do not rule out bacterial infection or co-infection  with other viruses. False positive results are rare but can occur, and confirmatory RT-PCR testing may be appropriate in some circumstances. The expected result is Negative. Fact Sheet for Patients: PodPark.tn Fact Sheet for Providers: GiftContent.is  This test is not yet approved or cleared by the [place] FDA and  has been authorized for detection and/or diagnosis of SARS-CoV-2 by FDA under an Emergency Use Authorization (EUA).  This EUA will remain in effect (meaning this test can be used) for the duration of  the COVID-19 declaration u nder Section 564(b)(1) of the Act,  21 U.S.C. section 360bbb-3(b)(1), unless the authorization is terminated or revoked sooner. Performed at [facility], [address], [place], [place] [address]   Urine culture     Status: None   Collection Time: 07/30/19 11:56 PM   Specimen: Urine, Clean Catch  Result Value Ref Range Status   Specimen Description   Final    URINE, CLEAN CATCH Performed at [facility], [address], [place], [place] [address]    Special Requests   Final    Normal Performed at [facility], [address], [place], [place] [address]    Culture   Final    NO GROWTH Performed at [facility], [place] [address] [place], [place] [address]    Report Status 08/01/2019 FINAL  Final  Blood Culture (routine x 2)     Status: None (Preliminary result)   Collection Time: 07/31/19  1:48 AM   Specimen: Left Antecubital; Blood  Result Value Ref Range Status   Specimen Description LEFT ANTECUBITAL  Final   Special Requests   Final    BOTTLES DRAWN AEROBIC AND ANAEROBIC Blood Culture adequate volume   Culture   Final    NO GROWTH 3 DAYS Performed at [facility], [address], [place], [place] [address]    Report Status PENDING  Incomplete  Blood Culture (routine x 2)     Status: None (Preliminary result)   Collection Time: 07/31/19  4:00 AM   Specimen: BLOOD  Result Value Ref Range Status   Specimen Description BLOOD LEFT FA  Final   Special Requests   Final    BOTTLES DRAWN AEROBIC AND ANAEROBIC Blood Culture adequate volume   Culture   Final    NO GROWTH 3 DAYS Performed at [facility], [address] [place], [place] [address]    Report Status PENDING  Incomplete         Radiology Studies: No results found.      Scheduled Meds: . albuterol  2 puff Inhalation Q6H  . vitamin C  500 mg Oral Daily  . insulin aspart  0-15 Units Subcutaneous TID WC  . insulin aspart  0-5 Units Subcutaneous QHS  . levothyroxine  112 mcg Oral  Q0600  . multivitamin with minerals  1 tablet Oral Daily  . sodium chloride flush  3 mL Intravenous Once  . warfarin  7.5 mg Oral ONCE-1800  . Warfarin - Pharmacist Dosing Inpatient   Does not apply q1800  . zinc sulfate  220 mg Oral Daily   Continuous Infusions: . heparin 1,100 Units/hr (08/03/19 0924)  . remdesivir 100 mg in NS 100 mL  100 mg (08/03/19 0926)     LOS: 3 days    Time spent: 30-35 minutes    Thornell Mule, DO [facility]   If 7PM-7AM, please contact night-coverage [email or web address] Password [email or web address] 08/03/2019, 2:18 PM

## 2019-08-03 NOTE — Consult Note (Signed)
[place] for Heparin and Warfarin Dosing and Monitoring  Indication: VTE prophylaxis, Hx of PE  Allergies  Allergen Reactions  . Gabapentin     Rash and itching    Patient Measurements: Height: 5\' 2"  (157.5 cm) Weight: 250 lb 1.6 oz (113.4 kg) IBW/kg (Calculated) : 50.1   Vital Signs: Temp: 99.4 F (37.4 C) (12/28 0802) Temp Source: Oral (12/28 0802) BP: 98/83 (12/28 1501) Pulse Rate: 60 (12/28 0802)  Labs: Recent Labs    08/01/19 0513 08/02/19 0639 08/02/19 1638 08/02/19 2004 08/03/19 0724 08/03/19 1635  HGB 11.4* 12.5  --   --  11.7*  --   HCT 35.0* 37.9  --   --  36.5  --   PLT 259 268  --   --  251  --   APTT  --   --  68*  --   --   --   LABPROT 15.3* 15.4*  --   --  18.4*  --   INR 1.2 1.2  --   --  1.5*  --   HEPARINUNFRC  --   --   --  0.45 0.78* 0.99*  CREATININE 0.83 1.08*  --   --  0.99  --     Estimated Creatinine Clearance: 54.8 mL/min (by C-G formula based on SCr of 0.99 mg/dL).   Medical History: Past Medical History:  Diagnosis Date  . COVID-19 07/30/2019  . Diabetes mellitus without complication ([place])   . GERD (gastroesophageal reflux disease)   . H/O blood clots 2014  . Hyperlipidemia   . Hypertension     Assessment: Pharmacy consulted for warfarin dosing and monitoring in 78 yo female with PMH of PE. INR subtherapeutic on admission.  Last dose reported on 12/23. Bridge heparin with warfarin until INR is at goal.   Home Regimen: Warfarin 5mg : Mon, Wed, Fri, Sat                             Warfarin 7.5mg : Sun, Tues, Thurs  DATE INR DOSE 12/25 1.3  7.5 mg 12/26 1.2 7.5 mg 12/27 1.2       10 mg 12/28   1.5         12/27@2004 : HL 0.45 12/28@0724   HL 0.78 (1200 units/hr) (single line infiltrated - Heparin paused ~ 1/2 hour while new line obtained)  Goal of Therapy:  INR 2-3 Monitor platelets by anticoagulation protocol: Yes   Plan:  Heparin: Heparin dosing weight 76.9 kg.  HL  supratherapeutic again. Per RN no s/sx of bleeding noted.  Will decrease heparin to 850 units/hr.  Will order heparin level in 8 hours. CBC in AM.    Rocky Morel, PharmD, BCPS Clinical Pharmacist 08/03/2019 7:07 PM

## 2019-08-04 LAB — COMPREHENSIVE METABOLIC PANEL
ALT: 19 U/L (ref 0–44)
AST: 25 U/L (ref 15–41)
Albumin: 3.3 g/dL — ABNORMAL LOW (ref 3.5–5.0)
Alkaline Phosphatase: 36 U/L — ABNORMAL LOW (ref 38–126)
Anion gap: 10 (ref 5–15)
BUN: 14 mg/dL (ref 8–23)
CO2: 22 mmol/L (ref 22–32)
Calcium: 8.2 mg/dL — ABNORMAL LOW (ref 8.9–10.3)
Chloride: 108 mmol/L (ref 98–111)
Creatinine, Ser: 0.87 mg/dL (ref 0.44–1.00)
GFR calc Af Amer: >60 mL/min (ref >60)
GFR calc non Af Amer: >60 mL/min (ref >60)
Glucose, Bld: 145 mg/dL — ABNORMAL HIGH (ref 70–99)
Potassium: 3.6 mmol/L (ref 3.5–5.1)
Sodium: 140 mmol/L (ref 135–145)
Total Bilirubin: 0.5 mg/dL (ref 0.3–1.2)
Total Protein: 6.2 g/dL — ABNORMAL LOW (ref 6.5–8.1)

## 2019-08-04 LAB — PROTIME-INR
INR: 1.8 — ABNORMAL HIGH (ref 0.8–1.2)
Prothrombin Time: 20.4 seconds — ABNORMAL HIGH (ref 11.4–15.2)

## 2019-08-04 LAB — FIBRIN DERIVATIVES D-DIMER (ARMC ONLY): Fibrin derivatives D-dimer (ARMC): 471.6 ng/mL (FEU) (ref 0.00–499.00)

## 2019-08-04 LAB — C-REACTIVE PROTEIN: CRP: 4 mg/dL — ABNORMAL HIGH (ref <1.0)

## 2019-08-04 LAB — FERRITIN: Ferritin: 614 ng/mL — ABNORMAL HIGH (ref 11–307)

## 2019-08-04 LAB — GLUCOSE, CAPILLARY: Glucose-Capillary: 147 mg/dL — ABNORMAL HIGH (ref 70–99)

## 2019-08-04 LAB — CBC
HCT: 33.5 % — ABNORMAL LOW (ref 36.0–46.0)
Hemoglobin: 11.1 g/dL — ABNORMAL LOW (ref 12.0–15.0)
MCH: 25.7 pg — ABNORMAL LOW (ref 26.0–34.0)
MCHC: 33.1 g/dL (ref 30.0–36.0)
MCV: 77.5 fL — ABNORMAL LOW (ref 80.0–100.0)
Platelets: 237 10*3/uL (ref 150–400)
RBC: 4.32 MIL/uL (ref 3.87–5.11)
RDW: 15 % (ref 11.5–15.5)
WBC: 6 10*3/uL (ref 4.0–10.5)
nRBC: 0 % (ref 0.0–0.2)

## 2019-08-04 LAB — HEPARIN LEVEL (UNFRACTIONATED): Heparin Unfractionated: 0.66 IU/mL (ref 0.30–0.70)

## 2019-08-04 MED ORDER — ADULT MULTIVITAMIN W/MINERALS CH: 1.0000 | ORAL_TABLET | Freq: Every day | ORAL | 0 refills | Status: DC

## 2019-08-04 MED ORDER — PSYLLIUM 95 % PO PACK: 1.0000 | PACK | Freq: Every day | ORAL | 0 refills | Status: DC | PRN

## 2019-08-04 MED ORDER — GUAIFENESIN-DM 100-10 MG/5ML PO SYRP: 10.0000 mL | ORAL_SOLUTION | ORAL | 0 refills | Status: DC | PRN

## 2019-08-04 MED ORDER — ZINC SULFATE 220 (50 ZN) MG PO CAPS: 220.0000 mg | ORAL_CAPSULE | Freq: Every day | ORAL | 0 refills | Status: DC

## 2019-08-04 MED ORDER — WARFARIN SODIUM 2.5 MG PO TABS: 7.5000 mg | ORAL_TABLET | Freq: Every day | ORAL | 0 refills | Status: AC

## 2019-08-04 MED ORDER — ALBUTEROL SULFATE HFA 108 (90 BASE) MCG/ACT IN AERS: 2.0000 | INHALATION_SPRAY | Freq: Four times a day (QID) | RESPIRATORY_TRACT | 1 refills | Status: DC

## 2019-08-04 MED ORDER — ASCORBIC ACID 500 MG PO TABS: 500.0000 mg | ORAL_TABLET | Freq: Every day | ORAL | 0 refills | Status: DC

## 2019-08-04 MED ORDER — MAGNESIUM HYDROXIDE 400 MG/5ML PO SUSP: 15.0000 mL | Freq: Every day | ORAL | 0 refills | Status: DC | PRN

## 2019-08-04 MED ORDER — WARFARIN SODIUM 7.5 MG PO TABS
7.5000 mg | ORAL_TABLET | Freq: Once | ORAL | Status: DC
  Filled 2019-08-04: qty 1

## 2019-08-04 NOTE — Consult Note (Signed)
[place] for Heparin and Warfarin Dosing and Monitoring  Indication: VTE prophylaxis, Hx of PE  Allergies  Allergen Reactions  . Gabapentin     Rash and itching    Patient Measurements: Height: 5\' 2"  (157.5 cm) Weight: 250 lb 1.6 oz (113.4 kg) IBW/kg (Calculated) : 50.1   Vital Signs: Temp: 99.4 F (37.4 C) (12/29 0434) Temp Source: Oral (12/29 0434) BP: 142/70 (12/29 0434) Pulse Rate: 59 (12/29 0434)  Labs: Recent Labs    08/02/19 0639 08/02/19 1638 08/03/19 0724 08/03/19 1635 08/04/19 0418  HGB 12.5  --  11.7*  --  11.1*  HCT 37.9  --  36.5  --  33.5*  PLT 268  --  251  --  237  APTT  --  68*  --   --   --   LABPROT 15.4*  --  18.4*  --  20.4*  INR 1.2  --  1.5*  --  1.8*  HEPARINUNFRC  --   --  0.78* 0.99* 0.66  CREATININE 1.08*  --  0.99  --  0.87    Estimated Creatinine Clearance: 62.4 mL/min (by C-G formula based on SCr of 0.87 mg/dL).   Medical History: Past Medical History:  Diagnosis Date  . COVID-19 07/30/2019  . Diabetes mellitus without complication ([place])   . GERD (gastroesophageal reflux disease)   . H/O blood clots 2014  . Hyperlipidemia   . Hypertension     Assessment: Pharmacy consulted for warfarin dosing and monitoring in 78 yo female with PMH of PE. INR subtherapeutic on admission.  Last dose reported on 12/23. Bridge heparin with warfarin until INR is at goal.   Home Regimen: Warfarin 5mg : Mon, Wed, Fri, Sat                             Warfarin 7.5mg : Aris Everts  DATE INR DOSE 12/25 1.3  7.5 mg 12/26 1.2 7.5 mg 12/27 1.2       10 mg 12/28   1.5         12/27@2004 : HL 0.45 12/28@0724   HL 0.78 (1200 units/hr) (single line infiltrated - Heparin paused ~ 1/2 hour while new line obtained)  Goal of Therapy:  INR 2-3 Monitor platelets by anticoagulation protocol: Yes   Plan:  12/29 @ 0418 HL 0.66 therapeutic. Will continue current rate and will recheck HL @ 1200, CBC trending down  will continue to monitor.  Tobie Lords, PharmD, BCPS Clinical Pharmacist 08/04/2019 6:29 AM

## 2019-08-04 NOTE — Progress Notes (Addendum)
Hannah Robertson to be D/C'd Home per MD order.  Discussed prescriptions and follow up appointments with the patient. Prescriptions given to patient, medication list explained in detail. Pt verbalized understanding.  Allergies as of 08/04/2019      Reactions   Gabapentin    Rash and itching      Medication List    STOP taking these medications   amLODipine 10 MG tablet Commonly known as: NORVASC     TAKE these medications   acetaminophen 500 MG tablet Commonly known as: TYLENOL Take 500 mg by mouth 2 (two) times daily.   albuterol 108 (90 Base) MCG/ACT inhaler Commonly known as: VENTOLIN HFA Inhale 2 puffs into the lungs every 6 (six) hours.   ascorbic acid 500 MG tablet Commonly known as: VITAMIN C Take 1 tablet (500 mg total) by mouth daily. Start taking on: August 05, 2019   cetirizine 5 MG tablet Commonly known as: ZYRTEC Take 1 tablet (5 mg total) by mouth daily.   famotidine 20 MG tablet Commonly known as: PEPCID Take 1 tablet (20 mg total) by mouth 2 (two) times daily.   fluticasone 50 MCG/ACT nasal spray Commonly known as: FLONASE Place 2 sprays into both nostrils daily.   guaiFENesin-dextromethorphan 100-10 MG/5ML syrup Commonly known as: ROBITUSSIN DM Take 10 mLs by mouth every 4 (four) hours as needed for cough.   Levemir FlexTouch 100 UNIT/ML Pen Generic drug: Insulin Detemir Inject 32 Units into the skin 2 (two) times daily before a meal.   levothyroxine 112 MCG tablet Commonly known as: SYNTHROID Take 112 mcg by mouth daily.   losartan-hydrochlorothiazide 100-25 MG tablet Commonly known as: HYZAAR Take 1 tablet by mouth daily.   magnesium hydroxide 400 MG/5ML suspension Commonly known as: MILK OF MAGNESIA Take 15 mLs by mouth daily as needed for mild constipation.   multivitamin with minerals Tabs tablet Take 1 tablet by mouth daily. Start taking on: August 05, 2019   pravastatin 40 MG tablet Commonly known as: PRAVACHOL Take 40 mg  by mouth daily.   psyllium 95 % Pack Commonly known as: HYDROCIL/METAMUCIL Take 1 packet by mouth daily as needed for mild constipation or moderate constipation.   solifenacin 5 MG tablet Commonly known as: VESICARE Take 5 mg by mouth daily.   warfarin 2.5 MG tablet Commonly known as: COUMADIN Take 3 tablets (7.5 mg total) by mouth daily at 6 PM. What changed:   how much to take  how to take this  when to take this  Another medication with the same name was removed. Continue taking this medication, and follow the directions you see here.   zinc sulfate 220 (50 Zn) MG capsule Take 1 capsule (220 mg total) by mouth daily. Start taking on: August 05, 2019       Vitals:   08/04/19 0434 08/04/19 0748  BP: (!) 142/70 121/66  Pulse: (!) 59 (!) 51  Resp: 20 18  Temp: 99.4 F (37.4 C) 98.2 F (36.8 C)  SpO2: 93% 92%    Tele box removed and returned. Skin clean, dry and intact without evidence of skin break down, no evidence of skin tears noted. IV catheter discontinued intact. Site without signs and symptoms of complications. Dressing and pressure applied. Pt denies pain at this time. No complaints noted.  An After Visit Summary was printed and given to the patient. Patient escorted via [place], and D/C home via private auto.  Rolley Sims

## 2019-08-04 NOTE — TOC Transition Note (Signed)
Transition of Care [facility]) - CM/SW Discharge Note   Patient Details  Name: Hannah Robertson MRN: [id number] Date of Birth: [birth year]  [facility] CM/SW Contact:  Ross Ludwig, LCSW Phone Number: 08/04/2019, 5:11 PM   Clinical Narrative:    Patient will be discharging back home with home health today.  Patient is [facility] with [facility] for [facility] PT and aide.  CSW signing off, patient did not have any other concerns or issues.   Final next level of care: [place] Barriers to Discharge: Barriers Resolved   Patient Goals and CMS Choice Patient states their goals for this hospitalization and ongoing recovery are:: To return back home with home health. CMS Medicare.gov Compare Post Acute Care list provided to:: Patient Choice offered to / list presented to : Patient  Discharge Placement  Discharging back home                     Discharge Plan and Services  Patient to discharge home with home health.   Post Acute Care Choice: Home Health            DME Agency: [facility]       HH Arranged: PT, Nurse's Aide HH Agency: [facility] Date [facility] Agency Contacted: 08/04/19 Time [place]: 1200 Representative spoke with at [place]: [place]  Social Determinants of Health ([place]) Interventions     Readmission Risk Interventions No flowsheet data found.

## 2019-08-05 NOTE — Discharge Summary (Signed)
Physician Discharge Summary  Patient ID: KALEIGHA CHAMBERLIN MRN: [id number] DOB/AGE: 79-May-1942 79 y.o.  Admit date: 07/30/2019 Discharge date: 08/05/2019  Admission Diagnoses:  Discharge Diagnoses:  Principal Problem:   AKI (acute kidney injury) ([place]) Active Problems:   Pneumonia due to COVID-19 virus   Viral gastroenteritis   Dehydration   Insulin dependent type 2 diabetes mellitus ([place])   Essential hypertension   Acquired hypothyroidism   Chronic anticoagulation   Hypotension due to hypovolemia   Discharged Condition: fair  Hospital Course:  Brief Narrative:  78 y.o.femalewith medical history of insulin-dependent type 2 diabetes, hypertension on chronic anticoagulation with Coumadin due to history of PE who presented to the ED from urgent care with diarrhea, cough and generalized weakness, nausea without vomiting, very poor PO intake. Tested positive for Covid.Sudden onset of symptoms 2 days prior.  In the ED, febrile 100.9, hypotensive 95/55.  Labs notable for creatinine 1.63 (from baseline 0.8).  Chest x-ray showed streaky opacities,representing early infection/atelectasis. Admitted to hospitalist service for further management.  IV fluids for AKI, remdesivir, vitamins.    AKI (acute kidney injury) ([place]) -resolved to baseline --Secondary to dehydration from hypovolemia from acute gastroenteritis related to COVID-19 --S/p gentle IV hydration for short duration --avoid nephrotoxins -We will up outpatient PCP  COVID-19 virus infection with hypoxia:  --Chestx-ray showing streaky opacities at bases reflecting atelectasis or early infection. --Status post remdesivir course completed on 08/04/2019 --Decadron started initially, stopped since no hypoxia / oxygen requirement --Patient remained on room air at rest and on ambulation and maintain saturation well above 90%  Viral gastroenteritisrelated to COVID-19 infection --Resolved mild fluctuation as expected -Continue  supportive care   --IV antiemetics as needed -Encourage p.o. hydration  Hypotension due to hypovolemia: Hypotension mostly resolved --Status post IV hydration - respiratory symptoms in the setting of Covid 19 infection -- Hold home antihypertensives --Continue to monitor closely; may resume home medication when stable  Insulin dependent type 2 diabetes mellitus ([place]): Stable blood glucose --Resume home medication  Acquired hypothyroidism: Acute issue --continue levothyroxine  Chronic anticoagulationsecondary to history of PE-currently subtherapeutic --Slowly improving towards goal; still subtherapeutic of 1.9 on the day of discharge -Per pharmacy recommendation patient was sent home with 7.5 mg p.o. daily of Coumadin -Also instructed unit clerk to make an appointment with patient's PCP in 2 to 3 days to recheck INR and follow-up with the PCP for further dose adjustment if needed -Patient has history of pulmonary embolism --Was on Coumadin per pharmacy and heparin bridging  Consults: None   Significant Diagnostic Studies: Radiology and Blood work   Treatments: As per hospital course and d/c medlist   Discharge Exam: Blood pressure 121/66, pulse (!) 51, temperature 98.2 F (36.8 C), temperature source Oral, resp. rate 18, height 5\' 2"  (1.575 m), weight 113.4 kg, SpO2 92 %.  General exam: awake, alert, no acute distress, obese HEENT: moist mucus membranes, hearing grossly normal  Respiratory system: clear to auscultation bilaterally, no wheezes, rales or rhonchi, normal respiratory effort. Cardiovascular system: normal S1/S2, RRR, no JVD, murmurs, rubs, gallops, no pedal edema.   Gastrointestinal system: soft, non-tender, non-distended abdomen, no guarding or rebound tenderness, normal bowel sounds. Central nervous system: alert and oriented x3. no gross focal neurologic deficits, normal speech Extremities: moves all, no edema, normal tone Skin: dry, intact, normal  temperature Psychiatry: normal mood, congruent affect, judgement and insight appear normal  Disposition: Discharge disposition: 06-Home-Health Care Svc     Patient was stable to be discharged home with  home health care services.  Also instructed unit clerk and nurses to make appointment with PCP for follow-up.  Warning signs and symptoms explained to patient when she must seek immediate medical attention.  Patient expressed understanding.  Discharge Instructions    Call MD for:  difficulty breathing, headache or visual disturbances   Complete by: As directed    Call MD for:  persistant dizziness or light-headedness   Complete by: As directed    Call MD for:  temperature >100.4   Complete by: As directed    Diet - low sodium heart healthy   Complete by: As directed    Increase activity slowly   Complete by: As directed    Walk with assistance   Complete by: As directed      Allergies as of 08/04/2019      Reactions   Gabapentin    Rash and itching      Medication List    STOP taking these medications   amLODipine 10 MG tablet Commonly known as: NORVASC     TAKE these medications   acetaminophen 500 MG tablet Commonly known as: TYLENOL Take 500 mg by mouth 2 (two) times daily.   albuterol 108 (90 Base) MCG/ACT inhaler Commonly known as: VENTOLIN HFA Inhale 2 puffs into the lungs every 6 (six) hours.   ascorbic acid 500 MG tablet Commonly known as: VITAMIN C Take 1 tablet (500 mg total) by mouth daily.   cetirizine 5 MG tablet Commonly known as: ZYRTEC Take 1 tablet (5 mg total) by mouth daily.   famotidine 20 MG tablet Commonly known as: PEPCID Take 1 tablet (20 mg total) by mouth 2 (two) times daily.   fluticasone 50 MCG/ACT nasal spray Commonly known as: FLONASE Place 2 sprays into both nostrils daily.   guaiFENesin-dextromethorphan 100-10 MG/5ML syrup Commonly known as: ROBITUSSIN DM Take 10 mLs by mouth every 4 (four) hours as needed for cough.    Levemir FlexTouch 100 UNIT/ML Pen Generic drug: Insulin Detemir Inject 32 Units into the skin 2 (two) times daily before a meal.   levothyroxine 112 MCG tablet Commonly known as: SYNTHROID Take 112 mcg by mouth daily.   losartan-hydrochlorothiazide 100-25 MG tablet Commonly known as: HYZAAR Take 1 tablet by mouth daily.   magnesium hydroxide 400 MG/5ML suspension Commonly known as: MILK OF MAGNESIA Take 15 mLs by mouth daily as needed for mild constipation.   multivitamin with minerals Tabs tablet Take 1 tablet by mouth daily.   pravastatin 40 MG tablet Commonly known as: PRAVACHOL Take 40 mg by mouth daily.   psyllium 95 % Pack Commonly known as: HYDROCIL/METAMUCIL Take 1 packet by mouth daily as needed for mild constipation or moderate constipation.   solifenacin 5 MG tablet Commonly known as: VESICARE Take 5 mg by mouth daily.   warfarin 2.5 MG tablet Commonly known as: COUMADIN Take 3 tablets (7.5 mg total) by mouth daily at 6 PM. What changed:   how much to take  how to take this  when to take this  Another medication with the same name was removed. Continue taking this medication, and follow the directions you see here.   zinc sulfate 220 (50 Zn) MG capsule Take 1 capsule (220 mg total) by mouth daily.      Follow-up Information    [facility]. Go on 08/05/2019.   Specialty: Family Medicine Why: appointment at at [facility]10am Contact information: [address] [place] [id number] [phone]  Signed: Thomasenia Bottoms 08/05/2019, 3:50 PM

## 2019-08-06 LAB — CULTURE, BLOOD (ROUTINE X 2)
Culture: NO GROWTH
Culture: NO GROWTH
Special Requests: ADEQUATE
Special Requests: ADEQUATE

## 2019-12-03 ENCOUNTER — Encounter: Payer: Self-pay | Admitting: Emergency Medicine

## 2019-12-03 ENCOUNTER — Ambulatory Visit
Admission: EM | Admit: 2019-12-03 | Discharge: 2019-12-03 | Disposition: A | Discharge status: Home / Self Care | Payer: Medicare HMO | Attending: Family Medicine | Admitting: Family Medicine

## 2019-12-03 ENCOUNTER — Other Ambulatory Visit: Payer: Self-pay

## 2019-12-03 ENCOUNTER — Ambulatory Visit (INDEPENDENT_AMBULATORY_CARE_PROVIDER_SITE_OTHER): Payer: Medicare HMO

## 2019-12-03 DIAGNOSIS — N390 Urinary tract infection, site not specified: Secondary | ICD-10-CM

## 2019-12-03 DIAGNOSIS — M25532 Pain in left wrist: Secondary | ICD-10-CM | POA: Insufficient documentation

## 2019-12-03 LAB — URINALYSIS, COMPLETE (UACMP) WITH MICROSCOPIC
Glucose, UA: NEGATIVE mg/dL
Nitrite: NEGATIVE
Protein, ur: >300 mg/dL — AB
Specific Gravity, Urine: 1.025 (ref 1.005–1.030)
WBC, UA: >50 WBC/hpf (ref 0–5)
pH: 5.5 (ref 5.0–8.0)

## 2019-12-03 MED ORDER — CEPHALEXIN 500 MG PO CAPS: 500.0000 mg | ORAL_CAPSULE | Freq: Two times a day (BID) | ORAL | 0 refills | Status: AC

## 2019-12-03 NOTE — Discharge Instructions (Addendum)
It was very nice seeing you today in clinic. Thank you for entrusting me with your care.  ° °As discussed, your urine is POSITIVE for infection. Will approach treatment as follows: ° °Prescription has been sent to your pharmacy for antibiotics.  °Please pick up and take as directed. FINISH the entire course of medication even if you are feeling better.  °A culture will be sent on your provided sample. If it comes back resistant to what I have prescribed you, someone will call you and let you know that we will need to change antibiotics. °Increase fluid intake as much as possible to flush your urinary tract.  °Water is always the best.  °Avoid caffeine until your infection clears up, as it can contribute to painful bladder spasms.  °May use Tylenol and/or Ibuprofen as needed for pain/fever. ° °Make arrangements to follow up with your regular doctor in 1 week for re-evaluation. If your symptoms/condition worsens, please seek follow up care either here or in the ER. Please remember, our [facility] providers are "right here with you" when you need us.  ° °Again, it was my pleasure to take care of you today. Thank you for choosing our clinic. I hope that you start to feel better quickly.  ° °Darcelle Herrada, MSN, APRN, FNP-C, CEN °Advanced Practice Provider °[facility] ° °

## 2019-12-03 NOTE — ED Triage Notes (Signed)
Pt c/o dysuria, urinary frequency, nausea, and flank pain. Started about 2 days ago. She also c/o left wrist pain. Started about 3 weeks ago. She states it feels like it burns.No h/o gout.

## 2019-12-04 NOTE — ED Provider Notes (Signed)
[place]   Name: Hannah Robertson DOB: 06/05/1941 MRN: [id number] CSN: [id number] PCP: [facility]  Arrival date and time:  12/03/19 0817  Chief Complaint:  Dysuria and Wrist Pain (left)  NOTE: Prior to seeing the patient today, I have reviewed the triage nursing documentation and vital signs. Clinical staff has updated patient's PMH/PSHx, current medication list, and drug allergies/intolerances to ensure comprehensive history available to assist in medical decision making.   History:   HPI: Hannah Robertson is a 79 y.o. female who presents today with multiple medical complaints as follows:  Patient with urinary symptoms that began with acute onset 2 days ago. She complains of dysuria, incontinence, frequency, and urgency. She has not appreciated any gross hematuria, nor has she noticed her urine being malodorous. She has had some mild nausea. Patient denies any associated vomiting, fever, or chills. She has experienced any pain in her lower back, flank area, and lower abdomen. Patient advises that she does not have a past medical history that is significant for recurrent urinary tract infections. She denies any vaginal pain, bleeding, or discharge.   Additionally, patient with complaints of a 3 week history of atraumatic pain and swelling in her LEFT wrist. She describes the pain as a "burning" sensation. She has FROM albeit painful. She reports that she has discussed this with her PCP and was advised to "go to [facility] for a free xray". Patient requesting radiographs today. In efforts to conservatively manage her symptoms at home, the patient notes that she has used APAP, which has not significantly helped to improve her symptoms.  She is unable to use NSAIDs as she is on daily warfarin therapy.   Past Medical History:  Diagnosis Date  . COVID-19 07/30/2019  . Diabetes mellitus without complication (HCC)   . GERD (gastroesophageal reflux disease)   . H/O blood clots 2014   . Hyperlipidemia   . Hypertension     Past Surgical History:  Procedure Laterality Date  . ABDOMINAL HYSTERECTOMY    . CESAREAN SECTION    . CHOLECYSTECTOMY    . COLONOSCOPY  11/27/13  . ivc filter  2014  . SHOULDER SURGERY    . TONSILLECTOMY      Family History  Problem Relation Age of Onset  . Alzheimer's disease Mother   . Hypertension Mother   . Prostate cancer Father   . Hypertension Father     Social History   Tobacco Use  . Smoking status: Never Smoker  . Smokeless tobacco: Never Used  Substance Use Topics  . Alcohol use: No    Alcohol/week: 0.0 standard drinks  . Drug use: No    Patient Active Problem List   Diagnosis Date Noted  . AKI (acute kidney injury) (HCC) 07/31/2019  . Viral gastroenteritis 07/31/2019  . Dehydration 07/31/2019  . 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 Essential hypertension 07/31/2019  . Acquired hypothyroidism 07/31/2019  . Chronic anticoagulation 07/31/2019  . Hypotension due to hypovolemia 07/31/2019  . Pneumonia due to COVID-19 virus 07/30/2019  . Right flank pain 07/09/2014    Home Medications:    Current Meds  Medication Sig  . acetaminophen (TYLENOL) 500 MG tablet Take 500 mg by mouth 2 (two) times daily.  Marland Kitchen albuterol (VENTOLIN HFA) 108 (90 Base) MCG/ACT inhaler Inhale 2 puffs into the lungs every 6 (six) hours.  Marland Kitchen ascorbic acid (VITAMIN C) 500 MG tablet Take 1 tablet (500 mg total) by mouth daily.  . cetirizine (  ZYRTEC) 5 MG tablet Take 1 tablet (5 mg total) by mouth daily.  . famotidine (PEPCID) 20 MG tablet Take 1 tablet (20 mg total) by mouth 2 (two) times daily.  . fluticasone (FLONASE) 50 MCG/ACT nasal spray Place 2 sprays into both nostrils daily.   Marland Kitchen guaiFENesin-dextromethorphan (ROBITUSSIN DM) 100-10 MG/5ML syrup Take 10 mLs by mouth every 4 (four) hours as needed for cough.  Marland Kitchen LEVEMIR FLEXTOUCH 100 UNIT/ML Pen Inject 32 Units into the skin 2 (two) times daily before a meal.  .  levothyroxine (SYNTHROID) 112 MCG tablet Take 112 mcg by mouth daily.  Marland Kitchen losartan-hydrochlorothiazide (HYZAAR) 100-25 MG tablet Take 1 tablet by mouth daily.  . magnesium hydroxide (MILK OF MAGNESIA) 400 MG/5ML suspension Take 15 mLs by mouth daily as needed for mild constipation.  . Multiple Vitamin (MULTIVITAMIN WITH MINERALS) TABS tablet Take 1 tablet by mouth daily.  . pravastatin (PRAVACHOL) 40 MG tablet Take 40 mg by mouth daily.   Marland Kitchen warfarin (COUMADIN) 2.5 MG tablet Take 3 tablets (7.5 mg total) by mouth daily at 6 PM.    Allergies:   Gabapentin  Review of Systems (ROS):  Review of systems NEGATIVE unless otherwise noted in narrative H&P section.   Vital Signs: Today's Vitals   12/03/19 0846 12/03/19 0852 12/03/19 1036  BP:  (!) 142/68   Pulse:  66   Resp:  18   Temp:  98.9 F (37.2 C)   TempSrc:  Oral   SpO2:  96%   Weight: 250 lb (113.4 kg)    Height: 5\' 2"  (1.575 m)    PainSc: 9   7     Physical Exam: Physical Exam  Constitutional: She is oriented to person, place, and time and well-developed, well-nourished, and in no distress.  HENT:  Head: Normocephalic and atraumatic.  Eyes: Pupils are equal, round, and reactive to light.  Cardiovascular: Normal rate, regular rhythm, normal heart sounds and intact distal pulses.  Pulmonary/Chest: Effort normal and breath sounds normal.  Abdominal: Soft. Normal appearance and bowel sounds are normal. She exhibits no distension. There is abdominal tenderness in the suprapubic area. There is no CVA tenderness.  Musculoskeletal:     Left wrist: Swelling and tenderness present. No deformity, effusion or crepitus. Normal range of motion.     Comments: (+) PMS noted distally; normal color, temperature, and capillary refill.  Neurological: She is alert and oriented to person, place, and time. Gait normal.  Skin: Skin is warm and dry. No rash noted. She is not diaphoretic.  Psychiatric: Mood, memory, affect and judgment normal.   Nursing note and vitals reviewed.   Urgent Care Treatments / Results:   Orders Placed This Encounter  Procedures  . Urine culture  . DG Wrist Complete Left  . Urinalysis, Complete w Microscopic    LABS: PLEASE NOTE: all labs that were ordered this encounter are listed, however only abnormal results are displayed. Labs Reviewed  URINALYSIS, COMPLETE (UACMP) WITH MICROSCOPIC - Abnormal; Notable for the following components:      Result Value   APPearance CLOUDY (*)    Hgb urine dipstick LARGE (*)    Bilirubin Urine SMALL (*)    Ketones, ur TRACE (*)    Protein, ur >300 (*)    Leukocytes,Ua MODERATE (*)    Bacteria, UA FEW (*)    All other components within normal limits  URINE CULTURE    EKG: -None  RADIOLOGY: DG Wrist Complete Left  Addendum Date: 12/03/2019   ADDENDUM REPORT:  12/03/2019 10:41 ADDENDUM: Sentence in the text should read: There are multiple foci of atherosclerotic calcification in the arterial vessels in the wrist region. Electronically Signed   By: Lowella Grip III M.D.   On: 12/03/2019 10:41   Result Date: 12/03/2019 CLINICAL DATA:  Pain and swelling EXAM: LEFT WRIST - COMPLETE 3+ VIEW COMPARISON:  None. FINDINGS: Frontal, oblique, lateral, and ulnar deviation scaphoid images were obtained. No fracture or dislocation. Joint spaces appear normal. No erosive change. There is multiple foci of aortic atherosclerotic calcification. IMPRESSION: No fracture or dislocation. No evident arthropathic change. Multiple foci of atherosclerotic calcification. Electronically Signed: By: Lowella Grip III M.D. On: 12/03/2019 09:37    PROCEDURES: Procedures  MEDICATIONS RECEIVED THIS VISIT: Medications - No data to display  PERTINENT CLINICAL COURSE NOTES/UPDATES:   Initial Impression / Assessment and Plan / Urgent Care Course:  Pertinent labs & imaging results that were available during my care of the patient were personally reviewed by me and considered in  my medical decision making (see lab/imaging section of note for values and interpretations).  ARAYLA KRUSCHKE is a 79 y.o. female who presents to [facility] today with complaints of Dysuria and Wrist Pain (left)  Patient is well appearing overall in clinic today. She does not appear to be in any acute distress. Presenting symptoms (see HPI) and exam as documented above.   Marland Kitchen UA was (+) for infection; reflex culture sent.  o Will treat with a  7 day course of cephalexin. Patient encouraged to complete the entire course of antibiotics even if she begins to feel better. She was advised that if culture demonstrates resistance to the prescribed antibiotic, she will be contacted and advised of the need to change the antibiotic being used to treat her infection.   o Patient encouraged to increase her fluid intake as much as possible. Discussed that water is always best to flush the urinary tract. She was advised to avoid caffeine containing fluids until her infections clears, as caffeine can cause her to experience painful bladder spasms.   o May use Tylenol as needed for pain/fever.  Regarding her wrist pain. Radiographs negative for acute fracture/dislocation. Suspect pain is related to OA. Recommended OTC compression sleeve/brace for support and comfort. Patient to rest, ice, and elevated. Continue APAP as needed. If not improving, patient directed to follow up with PCP for further evaluation.   Discussed follow up with primary care physician in 1 week for re-evaluation. I have reviewed the follow up and strict return precautions for any new or worsening symptoms. Patient is aware of symptoms that would be deemed urgent/emergent, and would thus require further evaluation either here or in the emergency department. At the time of discharge, she verbalized understanding and consent with the discharge plan as it was reviewed with her. All questions were fielded by provider and/or clinic staff prior  to patient discharge.    Final Clinical Impressions / Urgent Care Diagnoses:   Final diagnoses:  Urinary tract infection without hematuria, site unspecified  Wrist pain, acute, left    New Prescriptions:  [place] Controlled Substance Registry consulted? Not Applicable  Meds ordered this encounter  Medications  . cephALEXin (KEFLEX) 500 MG capsule    Sig: Take 1 capsule (500 mg total) by mouth 2 (two) times daily for 7 days.    Dispense:  14 capsule    Refill:  0    Recommended Follow up Care:  Patient encouraged to follow up with the following  provider within the specified time frame, or sooner as dictated by the severity of her symptoms. As always, she was instructed that for any urgent/emergent care needs, she should seek care either here or in the emergency department for more immediate evaluation.  Follow-up Information    [facility] In 1 week.   Specialty: Family Medicine Why: General reassessment of symptoms if not improving Contact information: [address] [place] [address] [phone]         NOTE: This note was prepared using Dragon dictation software along with smaller phrase technology. Despite my best ability to proofread, there is the potential that transcriptional errors may still occur from this process, and are completely unintentional.    Verlee Monte, NP 12/04/19 [phone]

## 2019-12-05 LAB — URINE CULTURE: Culture: >=100000 — AB

## 2020-03-02 ENCOUNTER — Encounter: Payer: Self-pay | Admitting: Emergency Medicine

## 2020-03-02 ENCOUNTER — Ambulatory Visit
Admission: EM | Admit: 2020-03-02 | Discharge: 2020-03-02 | Disposition: A | Discharge status: Home / Self Care | Payer: Medicare HMO | Attending: Emergency Medicine | Admitting: Emergency Medicine

## 2020-03-02 ENCOUNTER — Other Ambulatory Visit: Payer: Self-pay

## 2020-03-02 DIAGNOSIS — M5441 Lumbago with sciatica, right side: Secondary | ICD-10-CM | POA: Diagnosis not present

## 2020-03-02 MED ORDER — PREDNISONE 10 MG PO TABS: ORAL_TABLET | ORAL | 0 refills | Status: DC

## 2020-03-02 NOTE — ED Triage Notes (Signed)
Pt c/o back pain that radiates into her right leg. Started about 2 weeks ago. She has tried tylenol. She states the pain feels like it is grabbing sometimes. No known injury.

## 2020-03-02 NOTE — ED Provider Notes (Addendum)
[facility] ____________________________________________  Time seen: Approximately 10:58 AM  I have reviewed the triage vital signs and the nursing notes.   HISTORY  Chief Complaint Back Pain   HPI Hannah Robertson is a 79 y.o. female has medical history of lumbar degenerative disc disease, spinal stenosis, diabetes, DVT and hypertension presenting for evaluation of lower back pain. Reports lower back pain is in the middle to the right and intermittently "grabs "and shoots down her right leg. Denies any paresthesias, atypical swelling, rash, numbness. Denies any fall, direct injury or direct trauma. States back pain present for the last 2 weeks. States pain is worse with bending and twisting. Also states she has difficulty with some position changes at night when lying down stating it is hard to get up from a lying. Has continued remain active. Denies dysuria. Has taken some extra strength Tylenol over-the-counter intermittently, but she reports she just got that so unsure if it is helping. Denies recent cough, fever, chest pain, shortness of breath. Denies other recent sickness. Continues to remain active.  Care, [facility] : PCP    Past Medical History:  Diagnosis Date  . COVID-19 07/30/2019  . Diabetes mellitus without complication (HCC)   . GERD (gastroesophageal reflux disease)   . H/O blood clots 2014  . Hyperlipidemia   . Hypertension     Patient Active Problem List   Diagnosis Date Noted  . AKI (acute kidney injury) (HCC) 07/31/2019  . Viral gastroenteritis 07/31/2019  . Dehydration 07/31/2019  . 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 Essential hypertension 07/31/2019  . Acquired hypothyroidism 07/31/2019  . Chronic anticoagulation 07/31/2019  . Hypotension due to hypovolemia 07/31/2019  . Pneumonia due to COVID-19 virus 07/30/2019  . Right flank pain 07/09/2014    Past Surgical History:  Procedure Laterality Date  .  ABDOMINAL HYSTERECTOMY    . CESAREAN SECTION    . CHOLECYSTECTOMY    . COLONOSCOPY  11/27/13  . ivc filter  2014  . SHOULDER SURGERY    . TONSILLECTOMY       No current facility-administered medications for this encounter.  Current Outpatient Medications:  .  acetaminophen (TYLENOL) 500 MG tablet, Take 500 mg by mouth 2 (two) times daily., Disp: , Rfl:  .  albuterol (VENTOLIN HFA) 108 (90 Base) MCG/ACT inhaler, Inhale 2 puffs into the lungs every 6 (six) hours., Disp: 18 g, Rfl: 1 .  amLODipine (NORVASC) 10 MG tablet, Take 10 mg by mouth daily., Disp: , Rfl:  .  cetirizine (ZYRTEC) 5 MG tablet, Take 1 tablet (5 mg total) by mouth daily., Disp: 30 tablet, Rfl: 0 .  famotidine (PEPCID) 20 MG tablet, Take 1 tablet (20 mg total) by mouth 2 (two) times daily., Disp: 60 tablet, Rfl: 0 .  fluticasone (FLONASE) 50 MCG/ACT nasal spray, Place 2 sprays into both nostrils daily. , Disp: , Rfl:  .  LEVEMIR FLEXTOUCH 100 UNIT/ML Pen, Inject 32 Units into the skin 2 (two) times daily before a meal., Disp: , Rfl:  .  levothyroxine (SYNTHROID) 112 MCG tablet, Take 112 mcg by mouth daily., Disp: , Rfl:  .  losartan-hydrochlorothiazide (HYZAAR) 100-25 MG tablet, Take 1 tablet by mouth daily., Disp: , Rfl:  .  pravastatin (PRAVACHOL) 40 MG tablet, Take 40 mg by mouth daily. , Disp: , Rfl:  .  psyllium (HYDROCIL/METAMUCIL) 95 % PACK, Take 1 packet by mouth daily as needed for mild constipation or moderate constipation., Disp: 240 each, Rfl: 0 .  warfarin (COUMADIN) 2.5 MG tablet, Take 3 tablets (7.5 mg total) by mouth daily at 6 PM., Disp: 30 tablet, Rfl: 0 .  ascorbic acid (VITAMIN C) 500 MG tablet, Take 1 tablet (500 mg total) by mouth daily., Disp: 30 tablet, Rfl: 0 .  guaiFENesin-dextromethorphan (ROBITUSSIN DM) 100-10 MG/5ML syrup, Take 10 mLs by mouth every 4 (four) hours as needed for cough., Disp: 118 mL, Rfl: 0 .  magnesium hydroxide (MILK OF MAGNESIA) 400 MG/5ML suspension, Take 15 mLs by mouth daily  as needed for mild constipation., Disp: 355 mL, Rfl: 0 .  Multiple Vitamin (MULTIVITAMIN WITH MINERALS) TABS tablet, Take 1 tablet by mouth daily., Disp: 30 tablet, Rfl: 0 .  predniSONE (DELTASONE) 10 MG tablet, Start 60 mg po day one, then 50 mg po day two, taper by 10 mg daily until complete., Disp: 21 tablet, Rfl: 0  Allergies Gabapentin  Family History  Problem Relation Age of Onset  . Alzheimer's disease Mother   . Hypertension Mother   . Prostate cancer Father   . Hypertension Father     Social History Social History   Tobacco Use  . Smoking status: Never Smoker  . Smokeless tobacco: Never Used  Vaping Use  . Vaping Use: Never used  Substance Use Topics  . Alcohol use: No    Alcohol/week: 0.0 standard drinks  . Drug use: No    Review of Systems Constitutional: No fever Cardiovascular: Denies chest pain. Respiratory: Denies shortness of breath. Gastrointestinal: No abdominal pain.  No nausea, no vomiting.  No diarrhea.  No constipation. Genitourinary: Negative for dysuria. Musculoskeletal: Positive for back pain. Skin: Negative for rash. Neurological: Negative for numbness.   ____________________________________________   PHYSICAL EXAM:  VITAL SIGNS: ED Triage Vitals  Enc Vitals Group     BP 03/02/20 1006 (!) 141/58     Pulse Rate 03/02/20 1006 62     Resp 03/02/20 1006 18     Temp 03/02/20 1006 98.1 F (36.7 C)     Temp Source 03/02/20 1006 Oral     SpO2 03/02/20 1006 99 %     Weight 03/02/20 1001 (!) 250 lb (113.4 kg)     Height 03/02/20 1001 5\' 2"  (1.575 m)     Head Circumference --      Peak Flow --      Pain Score 03/02/20 1001 8     Pain Loc --      Pain Edu? --      Excl. in GC? --     Constitutional: Alert and oriented. Well appearing and in no acute distress. Eyes: Conjunctivae are normal.  ENT      Head: Normocephalic and atraumatic. Cardiovascular: Grossly normal heart sounds.  Good peripheral circulation. Respiratory: Normal  respiratory effort without tachypnea nor retractions.  Gastrointestinal: Soft and nontender. No CVA tenderness. Musculoskeletal: Ambulatory with mild antalgic gait. No cervical or thoracic tenderness to palpation. Mild lower midline lumbar tenderness to palpation and right paralumbar tenderness to palpation, changes positions quickly. No saddle anesthesia. Bilateral plantar flexion and dorsiflexion strong and equal. No hip tenderness or pelvic tenderness. Pain increased with lumbar flexion as well as right and left rotation. Neurologic:  Normal speech and language. No gross focal neurologic deficits are appreciated. Speech is normal. No gait instability.  Skin:  Skin is warm, dry and intact. No rash noted. Psychiatric: Mood and affect are normal. Speech and behavior are normal. Patient exhibits appropriate insight and judgment   ___________________________________________   LABS (all labs  ordered are listed, but only abnormal results are displayed)  Labs Reviewed - No data to display   PROCEDURES Procedures    INITIAL IMPRESSION / ASSESSMENT AND PLAN / ED COURSE  Pertinent labs & imaging results that were available during my care of the patient were reviewed by me and considered in my medical decision making (see chart for details).  Well-appearing patient. No acute distress. History of degenerative disc disease and spinal stenosis, lower back pain nontraumatic for last 2 weeks. Suspect inflammatory. Discussed with patient medication use versus imaging at this time, will defer imaging at this time as no trauma and treat with prednisone taper. Continue over-the-counter Tylenol as needed. Monitor blood sugar. Discussed very strict follow-up and return parameters including follow-up closely with primary care. Discussed indication, risks and benefits of medications with patient.   Discussed follow up with Primary care physician this week. Discussed follow up and return parameters including no  resolution or any worsening concerns. Patient verbalized understanding and agreed to plan.   ____________________________________________   FINAL CLINICAL IMPRESSION(S) / ED DIAGNOSES  Final diagnoses:  Acute bilateral low back pain with right-sided sciatica     ED Discharge Orders         Ordered    predniSONE (DELTASONE) 10 MG tablet     Discontinue  Reprint     03/02/20 1035           Note: This dictation was prepared with Dragon dictation along with smaller phrase technology. Any transcriptional errors that result from this process are unintentional.         Renford Dills, NP 03/02/20 1109

## 2020-03-02 NOTE — Discharge Instructions (Signed)
Take medication as prescribed. Rest. Alternate heat and ice. You may still take Tylenol over-the-counter.  Follow up with your primary care physician this week.   Return to urgent care as needed. Proceed directly to emergency room for any numbness, increased pain or worsening concerns.

## 2020-09-21 ENCOUNTER — Encounter: Payer: Self-pay | Admitting: Emergency Medicine

## 2020-09-21 ENCOUNTER — Other Ambulatory Visit: Payer: Self-pay

## 2020-09-21 ENCOUNTER — Ambulatory Visit
Admission: EM | Admit: 2020-09-21 | Discharge: 2020-09-21 | Disposition: A | Discharge status: Home / Self Care | Payer: Medicare HMO | Attending: Emergency Medicine | Admitting: Emergency Medicine

## 2020-09-21 DIAGNOSIS — N390 Urinary tract infection, site not specified: Secondary | ICD-10-CM | POA: Diagnosis present

## 2020-09-21 LAB — URINALYSIS, COMPLETE (UACMP) WITH MICROSCOPIC
Bilirubin Urine: NEGATIVE
Glucose, UA: NEGATIVE mg/dL
Ketones, ur: NEGATIVE mg/dL
Nitrite: NEGATIVE
Protein, ur: NEGATIVE mg/dL
Specific Gravity, Urine: 1.02 (ref 1.005–1.030)
pH: 6 (ref 5.0–8.0)

## 2020-09-21 MED ORDER — NITROFURANTOIN MONOHYD MACRO 100 MG PO CAPS: 100.0000 mg | ORAL_CAPSULE | Freq: Two times a day (BID) | ORAL | 0 refills | Status: DC

## 2020-09-21 NOTE — ED Triage Notes (Addendum)
Patient in today c/o right flank pain and urinary frequency x 1 week. No injury noted. Patient has taken OTC Tylenol for her symptoms. Patient diagnosed with right breast cancer ~2 weeks ago.

## 2020-09-21 NOTE — ED Provider Notes (Signed)
[facility]    CSN: [id number] Arrival date & time: 09/21/20  0865      History   Chief Complaint Chief Complaint  Patient presents with  . Flank Pain  . Urinary Frequency    HPI Hannah Robertson is a 80 y.o. female.   HPI   80 year old female here for evaluation of right flank pain and urinary frequency.  Patient reports that her right flank pain has been going on for the past week but her urinary frequency has been a longstanding issue.  Patient reports that she was treated by her primary care provider for overactive bladder with Detrol but states that that has not helped.  Patient states that she thinks she may have seen urology a year ago but does not remember.  Patient states that she has to wear Kotex all the time and that she does have urinary urgency and incontinence daily.  Patient denies pain with urination, fever, blood in her urine, cloudiness to her urine, or abdominal pain.  Past Medical History:  Diagnosis Date  . COVID-19 07/30/2019  . Diabetes mellitus without complication (HCC)   . GERD (gastroesophageal reflux disease)   . H/O blood clots 2014  . Hyperlipidemia   . Hypertension     Patient Active Problem List   Diagnosis Date Noted  . AKI (acute kidney injury) (HCC) 07/31/2019  . Viral gastroenteritis 07/31/2019  . Dehydration 07/31/2019  . 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 Essential hypertension 07/31/2019  . Acquired hypothyroidism 07/31/2019  . Chronic anticoagulation 07/31/2019  . Hypotension due to hypovolemia 07/31/2019  . Pneumonia due to COVID-19 virus 07/30/2019  . Right flank pain 07/09/2014    Past Surgical History:  Procedure Laterality Date  . ABDOMINAL HYSTERECTOMY    . CESAREAN SECTION    . CHOLECYSTECTOMY    . COLONOSCOPY  11/27/13  . ivc filter  2014  . SHOULDER SURGERY    . TONSILLECTOMY      OB History    Gravida  4   Para  2   Term      Preterm      AB  2   Living  3      SAB      IAB  2   Ectopic      Multiple  1   Live Births           Obstetric Comments  1st Menstrual Cycle:  14 1st Pregnancy:  20          Home Medications    Prior to Admission medications   Medication Sig Start Date End Date Taking? Authorizing Provider  acetaminophen (TYLENOL) 500 MG tablet Take 500 mg by mouth 2 (two) times daily.   Yes [provider]  amLODipine (NORVASC) 10 MG tablet Take 10 mg by mouth daily. 10/26/19  Yes [provider]  ascorbic acid (VITAMIN C) 500 MG tablet Take 1 tablet (500 mg total) by mouth daily. 08/05/19  Yes Thomasenia Bottoms, MD  LEVEMIR FLEXTOUCH 100 UNIT/ML Pen Inject 32 Units into the skin 2 (two) times daily before a meal. 05/06/19  Yes [provider]  levothyroxine (SYNTHROID) 112 MCG tablet Take 112 mcg by mouth daily. 07/25/19  Yes [provider]  loperamide (IMODIUM) 2 MG capsule Take 2 mg by mouth at bedtime. 09/13/20  Yes [provider]  losartan-hydrochlorothiazide (HYZAAR) 100-25 MG tablet Take 1 tablet by mouth daily. 06/12/19  Yes [provider]  nitrofurantoin, macrocrystal-monohydrate, (MACROBID) 100 MG capsule Take 1 capsule (100 mg total) by mouth 2 (two) times daily. 09/21/20  Yes Becky Augusta, NP  omeprazole (PRILOSEC) 20 MG capsule Take 20 mg by mouth daily.   Yes [provider]  pravastatin (PRAVACHOL) 40 MG tablet Take 40 mg by mouth daily.  04/07/14  Yes [provider]  solifenacin (VESICARE) 5 MG tablet Take 5 mg by mouth daily. 08/10/20  Yes [provider]  warfarin (COUMADIN) 2.5 MG tablet Take 3 tablets (7.5 mg total) by mouth daily at 6 PM. 08/04/19  Yes Thomasenia Bottoms, MD  albuterol (VENTOLIN HFA) 108 (90 Base) MCG/ACT inhaler Inhale 2 puffs into the lungs every 6 (six) hours. 08/04/19 09/21/20  Thomasenia Bottoms, MD  cetirizine (ZYRTEC) 5 MG tablet Take 1 tablet (5 mg total) by mouth daily. 07/16/18 09/21/20  Tommie Sams, DO   famotidine (PEPCID) 20 MG tablet Take 1 tablet (20 mg total) by mouth 2 (two) times daily. 07/16/18 09/21/20  Tommie Sams, DO  fluticasone (FLONASE) 50 MCG/ACT nasal spray Place 2 sprays into both nostrils daily.  04/07/14 09/21/20  [provider]    Family History Family History  Problem Relation Age of Onset  . Alzheimer's disease Mother   . Hypertension Mother   . Prostate cancer Father   . Hypertension Father     Social History Social History   Tobacco Use  . Smoking status: Never Smoker  . Smokeless tobacco: Never Used  Vaping Use  . Vaping Use: Never used  Substance Use Topics  . Alcohol use: No    Alcohol/week: 0.0 standard drinks  . Drug use: No     Allergies   Gabapentin   Review of Systems Review of Systems  Constitutional: Negative for activity change and appetite change.  Gastrointestinal: Negative for abdominal pain and nausea.  Genitourinary: Positive for frequency and urgency. Negative for dysuria, hematuria, pelvic pain, vaginal bleeding, vaginal discharge and vaginal pain.  Skin: Negative for rash.  Hematological: Negative.   Psychiatric/Behavioral: Negative.      Physical Exam Triage Vital Signs ED Triage Vitals  Enc Vitals Group     BP 09/21/20 0953 (!) 156/66     Pulse Rate 09/21/20 0953 (!) 58     Resp 09/21/20 0953 18     Temp 09/21/20 0953 98.4 F (36.9 C)     Temp Source 09/21/20 0953 Oral     SpO2 09/21/20 0953 99 %     Weight 09/21/20 0954 248 lb (112.5 kg)     Height 09/21/20 0954 5\' 1"  (1.549 m)     Head Circumference --      Peak Flow --      Pain Score 09/21/20 0952 8     Pain Loc --      Pain Edu? --      Excl. in GC? --    No data found.  Updated Vital Signs BP (!) 156/66 (BP Location: Right Arm)   Pulse (!) 58   Temp 98.4 F (36.9 C) (Oral)   Resp 18   Ht 5\' 1"  (1.549 m)   Wt 248 lb (112.5 kg)   SpO2 99%   BMI 46.86 kg/m   Visual Acuity Right Eye Distance:   Left Eye Distance:   Bilateral  Distance:    Right Eye Near:   Left Eye Near:    Bilateral Near:     Physical Exam Vitals and nursing note reviewed.  Constitutional:  General: She is not in acute distress.    Appearance: Normal appearance.  HENT:     Head: Normocephalic and atraumatic.  Cardiovascular:     Rate and Rhythm: Normal rate and regular rhythm.     Pulses: Normal pulses.     Heart sounds: Normal heart sounds. No murmur heard.   Pulmonary:     Effort: Pulmonary effort is normal.     Breath sounds: Normal breath sounds. No wheezing, rhonchi or rales.  Abdominal:     Tenderness: There is no right CVA tenderness or left CVA tenderness.  Skin:    General: Skin is warm and dry.     Findings: No erythema or rash.  Neurological:     General: No focal deficit present.     Mental Status: She is alert and oriented to person, place, and time.  Psychiatric:        Mood and Affect: Mood normal.        Behavior: Behavior normal.        Thought Content: Thought content normal.        Judgment: Judgment normal.      UC Treatments / Results  Labs (all labs ordered are listed, but only abnormal results are displayed) Labs Reviewed  URINALYSIS, COMPLETE (UACMP) WITH MICROSCOPIC - Abnormal; Notable for the following components:      Result Value   APPearance HAZY (*)    Hgb urine dipstick TRACE (*)    Leukocytes,Ua MODERATE (*)    Bacteria, UA FEW (*)    All other components within normal limits  URINE CULTURE    EKG   Radiology No results found.  Procedures Procedures (including critical care time)  Medications Ordered in UC Medications - No data to display  Initial Impression / Assessment and Plan / UC Course  I have reviewed the triage vital signs and the nursing notes.  Pertinent labs & imaging results that were available during my care of the patient were reviewed by me and considered in my medical decision making (see chart for details).   Patient is a very pleasant 80 year old  female here for evaluation of right flank pain that is also been associated with urinary frequency.  The urinary frequency is a longstanding issue.  I asked patient if she ever seen urology and she says she thought she did about a year ago.  I cannot find any record of her seeing urology in epic.  [facility] and I found notes for GYN and gastroenterology.  Patient is nontoxic in appearance and has no CVA tenderness on exam.  Will collect urine and sent for analysis.   UA is positive for trace blood, moderate leukocytes, 11-20 WBCs, and few bacteria.  No nitrites and 6-10 squamous cells present.  Will send UA for culture.  We will treat patient for UTI with Macrobid twice daily for 5 days.  We will also recommend patient follow-up with urology to discuss her urinary frequency and continued incontinence.   Final Clinical Impressions(s) / UC Diagnoses   Final diagnoses:  Lower urinary tract infectious disease     Discharge Instructions     Macrobid twice daily with food for 5 days for treatment of urinary tract infection.  Increase your oral fluid intake so that you increase your urine production and help to flush your urinary system.  Request a referral from your primary care provider for urology to be evaluated for your continued urinary incontinence and to discuss pelvic floor  physical therapy.  If you develop a fever, abdominal pain, or nausea and vomiting return for reevaluation or go to the ER.    ED Prescriptions    Medication Sig Dispense Auth. Provider   nitrofurantoin, macrocrystal-monohydrate, (MACROBID) 100 MG capsule Take 1 capsule (100 mg total) by mouth 2 (two) times daily. 10 capsule Becky Augusta, NP     PDMP not reviewed this encounter.   Becky Augusta, NP 09/21/20 1054

## 2020-09-21 NOTE — Discharge Instructions (Addendum)
Macrobid twice daily with food for 5 days for treatment of urinary tract infection.  Increase your oral fluid intake so that you increase your urine production and help to flush your urinary system.  Request a referral from your primary care provider for urology to be evaluated for your continued urinary incontinence and to discuss pelvic floor physical therapy.  If you develop a fever, abdominal pain, or nausea and vomiting return for reevaluation or go to the ER.

## 2020-09-23 ENCOUNTER — Telehealth (HOSPITAL_COMMUNITY): Payer: Self-pay | Admitting: Emergency Medicine

## 2020-09-23 LAB — URINE CULTURE
Culture: >=100000 — AB
Special Requests: NORMAL

## 2020-09-23 MED ORDER — CEPHALEXIN 500 MG PO CAPS: 500.0000 mg | ORAL_CAPSULE | Freq: Two times a day (BID) | ORAL | 0 refills | Status: AC

## 2021-05-07 ENCOUNTER — Encounter: Payer: Self-pay | Admitting: Emergency Medicine

## 2021-05-07 ENCOUNTER — Other Ambulatory Visit: Payer: Self-pay

## 2021-05-07 ENCOUNTER — Ambulatory Visit
Admission: EM | Admit: 2021-05-07 | Discharge: 2021-05-07 | Disposition: A | Discharge status: Home / Self Care | Payer: Medicare (Managed Care) | Attending: Physician Assistant | Admitting: Physician Assistant

## 2021-05-07 DIAGNOSIS — Z20822 Contact with and (suspected) exposure to covid-19: Secondary | ICD-10-CM | POA: Diagnosis not present

## 2021-05-07 DIAGNOSIS — B349 Viral infection, unspecified: Secondary | ICD-10-CM

## 2021-05-07 MED ORDER — MOLNUPIRAVIR EUA 200MG CAPSULE: 4.0000 | ORAL_CAPSULE | Freq: Two times a day (BID) | ORAL | 0 refills | Status: AC

## 2021-05-07 NOTE — ED Provider Notes (Signed)
[facility]    CSN: [id number] Arrival date & time: 05/07/21  1012      History   Chief Complaint Chief Complaint  Patient presents with  . Cough  . Covid Exposure    HPI Hannah Robertson is a 80 y.o. female.   Patient presents with productive cough with Stellarose Cerny phlegm, nasal congestion, diarrhea, poor appetite, fever for 1 day.  Last episode of diarrhea in office, described as watery.  Exposed to son who has COVID.  Has not attempted treatment of symptoms.  Vaccinated.  Denies headaches, ear pain or fullness, shortness of breath, wheezing, sore throat, abdominal pain, nausea, vomiting.  History of diabetes insulin-dependent, controlled, hypothyroidism, hypertension, hyperlipidemia.  Endorses last time she had COVID she required hospitalization.  Past Medical History:  Diagnosis Date  . COVID-19 07/30/2019  . Diabetes mellitus without complication (HCC)   . GERD (gastroesophageal reflux disease)   . H/O blood clots 2014  . Hyperlipidemia   . Hypertension     Patient Active Problem List   Diagnosis Date Noted  . AKI (acute kidney injury) (HCC) 07/31/2019  . Viral gastroenteritis 07/31/2019  . Dehydration 07/31/2019  . 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 Essential hypertension 07/31/2019  . Acquired hypothyroidism 07/31/2019  . Chronic anticoagulation 07/31/2019  . Hypotension due to hypovolemia 07/31/2019  . Pneumonia due to COVID-19 virus 07/30/2019  . Right flank pain 07/09/2014    Past Surgical History:  Procedure Laterality Date  . ABDOMINAL HYSTERECTOMY    . CESAREAN SECTION    . CHOLECYSTECTOMY    . COLONOSCOPY  11/27/13  . ivc filter  2014  . SHOULDER SURGERY    . TONSILLECTOMY      OB History     Gravida  4   Para  2   Term      Preterm      AB  2   Living  3      SAB      IAB  2   Ectopic      Multiple  1   Live Births           Obstetric Comments  1st Menstrual Cycle:  14 1st Pregnancy:   20           Home Medications    Prior to Admission medications   Medication Sig Start Date End Date Taking? Authorizing Provider  molnupiravir EUA (LAGEVRIO) 200 mg CAPS capsule Take 4 capsules (800 mg total) by mouth 2 (two) times daily for 5 days. 05/07/21 05/12/21 Yes Annajulia Lewing, Elita Boone, NP  acetaminophen (TYLENOL) 500 MG tablet Take 500 mg by mouth 2 (two) times daily.    [provider]  amLODipine (NORVASC) 10 MG tablet Take 10 mg by mouth daily. 10/26/19   [provider]  ascorbic acid (VITAMIN C) 500 MG tablet Take 1 tablet (500 mg total) by mouth daily. 08/05/19   Thomasenia Bottoms, MD  LEVEMIR FLEXTOUCH 100 UNIT/ML Pen Inject 32 Units into the skin 2 (two) times daily before a meal. 05/06/19   [provider]  levothyroxine (SYNTHROID) 112 MCG tablet Take 112 mcg by mouth daily. 07/25/19   [provider]  loperamide (IMODIUM) 2 MG capsule Take 2 mg by mouth at bedtime. 09/13/20   [provider]  losartan-hydrochlorothiazide (HYZAAR) 100-25 MG tablet Take 1 tablet by mouth daily. 06/12/19   [provider]  nitrofurantoin, macrocrystal-monohydrate, (MACROBID) 100 MG capsule Take 1 capsule (100 mg  total) by mouth 2 (two) times daily. 09/21/20   Becky Augusta, NP  omeprazole (PRILOSEC) 20 MG capsule Take 20 mg by mouth daily.    [provider]  pravastatin (PRAVACHOL) 40 MG tablet Take 40 mg by mouth daily.  04/07/14   [provider]  solifenacin (VESICARE) 5 MG tablet Take 5 mg by mouth daily. 08/10/20   [provider]  warfarin (COUMADIN) 2.5 MG tablet Take 3 tablets (7.5 mg total) by mouth daily at 6 PM. 08/04/19   Thomasenia Bottoms, MD  albuterol (VENTOLIN HFA) 108 (90 Base) MCG/ACT inhaler Inhale 2 puffs into the lungs every 6 (six) hours. 08/04/19 09/21/20  Thomasenia Bottoms, MD  cetirizine (ZYRTEC) 5 MG tablet Take 1 tablet (5 mg total) by mouth daily. 07/16/18 09/21/20  Tommie Sams, DO  famotidine (PEPCID) 20  MG tablet Take 1 tablet (20 mg total) by mouth 2 (two) times daily. 07/16/18 09/21/20  Tommie Sams, DO  fluticasone (FLONASE) 50 MCG/ACT nasal spray Place 2 sprays into both nostrils daily.  04/07/14 09/21/20  [provider]    Family History Family History  Problem Relation Age of Onset  . Alzheimer's disease Mother   . Hypertension Mother   . Prostate cancer Father   . Hypertension Father     Social History Social History   Tobacco Use  . Smoking status: Never  . Smokeless tobacco: Never  Vaping Use  . Vaping Use: Never used  Substance Use Topics  . Alcohol use: No    Alcohol/week: 0.0 standard drinks  . Drug use: No     Allergies   Gabapentin   Review of Systems Review of Systems Deferred to HPI   Physical Exam Triage Vital Signs ED Triage Vitals  Enc Vitals Group     BP 05/07/21 1044 (!) 150/78     Pulse Rate 05/07/21 1044 79     Resp 05/07/21 1044 15     Temp 05/07/21 1044 98.9 F (37.2 C)     Temp Source 05/07/21 1044 Oral     SpO2 05/07/21 1044 97 %     Weight 05/07/21 1041 246 lb (111.6 kg)     Height 05/07/21 1041 5\' 1"  (1.549 m)     Head Circumference --      Peak Flow --      Pain Score 05/07/21 1040 0     Pain Loc --      Pain Edu? --      Excl. in GC? --    No data found.  Updated Vital Signs BP (!) 150/78 (BP Location: Left Arm)   Pulse 79   Temp 98.9 F (37.2 C) (Oral)   Resp 15   Ht 5\' 1"  (1.549 m)   Wt 246 lb (111.6 kg)   SpO2 97%   BMI 46.48 kg/m   Visual Acuity Right Eye Distance:   Left Eye Distance:   Bilateral Distance:    Right Eye Near:   Left Eye Near:    Bilateral Near:     Physical Exam Constitutional:      Appearance: Normal appearance. She is obese.  HENT:     Head: Normocephalic.     Right Ear: Tympanic membrane, ear canal and external ear normal.     Left Ear: Tympanic membrane, ear canal and external ear normal.     Nose: Congestion present. No rhinorrhea.     Mouth/Throat:     Mouth:  Mucous membranes are moist.  Pharynx: Posterior oropharyngeal erythema present.  Eyes:     Extraocular Movements: Extraocular movements intact.  Cardiovascular:     Rate and Rhythm: Normal rate and regular rhythm.     Pulses: Normal pulses.     Heart sounds: Normal heart sounds.  Pulmonary:     Effort: Pulmonary effort is normal.  Musculoskeletal:     Cervical back: Normal range of motion and neck supple.  Skin:    General: Skin is dry.  Neurological:     Mental Status: She is alert and oriented to person, place, and time. Mental status is at baseline.  Psychiatric:        Mood and Affect: Mood normal.        Behavior: Behavior normal.     UC Treatments / Results  Labs (all labs ordered are listed, but only abnormal results are displayed) Labs Reviewed  SARS CORONAVIRUS 2 (TAT 6-24 HRS)    EKG   Radiology No results found.  Procedures Procedures (including critical care time)  Medications Ordered in UC Medications - No data to display  Initial Impression / Assessment and Plan / UC Course  I have reviewed the triage vital signs and the nursing notes.  Pertinent labs & imaging results that were available during my care of the patient were reviewed by me and considered in my medical decision making (see chart for details).  Viral illness Exposure to COVID-19 virus  COVID test pending, due to patient history will be started on antiviral, strict return precautions given worsening symptoms or difficulty breathing to go to nearest emergency department for further evaluation  1. Molnupiravir 800 mg bid for 5 days   2.  Over-the-counter medications for further symptom management  Final Clinical Impressions(s) / UC Diagnoses   Final diagnoses:  Viral illness  Exposure to COVID-19 virus     Discharge Instructions      May start use of antiviral medication, take 4 pills every morning and every evening for the next 5 days  Your COVID test is pending, we will  call you if positive, I do believe your symptoms are still being caused by a virus therefore even if you do not have COVID you may continue use of medication  You may use any over-the-counter medication as needed to further help with symptom management  At any point if you begin to have difficulty breathing please go to the nearest emergency department for further evaluation   ED Prescriptions     Medication Sig Dispense Auth. Provider   molnupiravir EUA (LAGEVRIO) 200 mg CAPS capsule Take 4 capsules (800 mg total) by mouth 2 (two) times daily for 5 days. 40 capsule Alinah Sheard, Elita Boone, NP      PDMP not reviewed this encounter.   Valinda Hoar, NP 05/07/21 1138

## 2021-05-07 NOTE — ED Triage Notes (Signed)
Patient states that her son tested positive for COVID last week.  Patient states that she started having cough, congestion and fever yesterday.

## 2021-05-07 NOTE — Discharge Instructions (Signed)
May start use of antiviral medication, take 4 pills every morning and every evening for the next 5 days  Your COVID test is pending, we will call you if positive, I do believe your symptoms are still being caused by a virus therefore even if you do not have COVID you may continue use of medication  You may use any over-the-counter medication as needed to further help with symptom management  At any point if you begin to have difficulty breathing please go to the nearest emergency department for further evaluation

## 2021-05-08 LAB — SARS CORONAVIRUS 2 (TAT 6-24 HRS): SARS Coronavirus 2: NEGATIVE

## 2021-08-31 ENCOUNTER — Ambulatory Visit
Admission: EM | Admit: 2021-08-31 | Discharge: 2021-08-31 | Disposition: A | Discharge status: Home / Self Care | Payer: Medicare HMO

## 2021-08-31 ENCOUNTER — Other Ambulatory Visit: Payer: Self-pay

## 2021-08-31 DIAGNOSIS — M25561 Pain in right knee: Secondary | ICD-10-CM | POA: Diagnosis not present

## 2021-08-31 DIAGNOSIS — M1711 Unilateral primary osteoarthritis, right knee: Secondary | ICD-10-CM

## 2021-08-31 DIAGNOSIS — G8929 Other chronic pain: Secondary | ICD-10-CM

## 2021-08-31 NOTE — Discharge Instructions (Signed)
Go to [facility] today so you can get an injection today

## 2021-08-31 NOTE — ED Provider Notes (Signed)
[facility]    CSN: [id number] Arrival date & time: 08/31/21  1152      History   Chief Complaint Chief Complaint  Patient presents with   Knee Pain    HPI Hannah Robertson is a 81 y.o. female who is here due to having worse R knee pain on medial area x 2 months. Hurts her to walk. She cant get to see her PCP but once a year and was hoping we could inject it. Denies redness or heat, or an injury.     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/31/2019   Essential hypertension 07/31/2019   Acquired hypothyroidism 07/31/2019   Chronic anticoagulation 07/31/2019   Hypotension due to hypovolemia 07/31/2019   Pneumonia due to COVID-19 virus 07/30/2019   Right flank pain 07/09/2014    Past Surgical History:  Procedure Laterality Date   ABDOMINAL HYSTERECTOMY     CESAREAN SECTION     CHOLECYSTECTOMY     COLONOSCOPY  11/27/13   ivc filter  2014   SHOULDER SURGERY     TONSILLECTOMY      OB History     Gravida  4   Para  2   Term      Preterm      AB  2   Living  3      SAB      IAB  2   Ectopic      Multiple  1   Live Births           Obstetric Comments  1st Menstrual Cycle:  14 1st Pregnancy:  20           Home Medications    Prior to Admission medications   Medication Sig Start Date End Date Taking? Authorizing Provider  acetaminophen (TYLENOL) 500 MG tablet Take 500 mg by mouth 2 (two) times daily.   Yes [provider]  amLODipine (NORVASC) 10 MG tablet Take 10 mg by mouth daily. 10/26/19  Yes [provider]  LEVEMIR  FLEXTOUCH 100 UNIT/ML Pen Inject 32 Units into the skin 2 (two) times daily before a meal. 05/06/19  Yes [provider]  levothyroxine (SYNTHROID) 112 MCG tablet Take 112 mcg by mouth daily. 07/25/19  Yes [provider]  losartan-hydrochlorothiazide (HYZAAR) 100-25 MG tablet Take 1 tablet by mouth daily. 06/12/19  Yes [provider]  omeprazole (PRILOSEC) 20 MG capsule Take 20 mg by mouth daily.   Yes [provider]  pravastatin (PRAVACHOL) 40 MG tablet Take 40 mg by mouth daily.  04/07/14  Yes [provider]  solifenacin (VESICARE) 5 MG tablet Take 5 mg by mouth daily. 08/10/20  Yes [provider]  warfarin (COUMADIN) 2.5 MG tablet Take 3 tablets (7.5 mg total) by mouth daily at 6 PM. 08/04/19  Yes Thomasenia Bottoms, MD  albuterol (VENTOLIN HFA) 108 (90 Base) MCG/ACT inhaler Inhale 2 puffs into the lungs every 6 (six) hours. 08/04/19 09/21/20  Thomasenia Bottoms, MD  cetirizine (ZYRTEC) 5 MG tablet Take 1 tablet (5 mg total) by mouth daily. 07/16/18  09/21/20  Coral Spikes, DO  famotidine (PEPCID) 20 MG tablet Take 1 tablet (20 mg total) by mouth 2 (two) times daily. 07/16/18 09/21/20  Coral Spikes, DO  fluticasone (FLONASE) 50 MCG/ACT nasal spray Place 2 sprays into both nostrils daily.  04/07/14 09/21/20  [provider]    Family History Family History  Problem Relation Age of Onset   Alzheimer's disease Mother    Hypertension Mother    Prostate cancer Father    Hypertension Father     Social History Social History   Tobacco Use   Smoking status: Never   Smokeless tobacco: Never  Vaping Use   Vaping Use: Never used  Substance Use Topics   Alcohol use: No    Alcohol/week: 0.0 standard drinks   Drug use: No     Allergies   Gabapentin   Review of Systems Review of Systems  Musculoskeletal:  Positive for arthralgias, gait problem and joint swelling.  Skin:  Negative for color change, rash and wound.    Physical  Exam Triage Vital Signs ED Triage Vitals  Enc Vitals Group     BP 08/31/21 1210 (!) 146/78     Pulse Rate 08/31/21 1210 74     Resp 08/31/21 1210 18     Temp 08/31/21 1210 98 F (36.7 C)     Temp Source 08/31/21 1210 Oral     SpO2 08/31/21 1210 98 %     Weight 08/31/21 1207 236 lb (107 kg)     Height 08/31/21 1207 5\' 1"  (1.549 m)     Head Circumference --      Peak Flow --      Pain Score 08/31/21 1207 10     Pain Loc --      Pain Edu? --      Excl. in [place]? --    No data found.  Updated Vital Signs BP (!) 146/78 (BP Location: Left Arm)    Pulse 74    Temp 98 F (36.7 C) (Oral)    Resp 18    Ht 5\' 1"  (1.549 m)    Wt 236 lb (107 kg)    SpO2 98%    BMI 44.59 kg/m   Visual Acuity Right Eye Distance:   Left Eye Distance:   Bilateral Distance:    Right Eye Near:   Left Eye Near:    Bilateral Near:     Physical Exam Vitals and nursing note reviewed.  Constitutional:      General: She is not in acute distress.    Appearance: She is obese. She is not toxic-appearing.     Comments: Uses a cane  Pulmonary:     Effort: Pulmonary effort is normal.  Musculoskeletal:     Comments: R KNEE- with enlarged joint, has crepitations with ROM, and pain on medial joint line region. No effusion, redness or heat noted.   Skin:    General: Skin is warm and dry.     Findings: No rash.  Neurological:     Mental Status: She is alert and oriented to person, place, and time.     Gait: Gait abnormal.     Comments: Uses a cane and walks with a slight limp  Psychiatric:        Mood and Affect: Mood normal.        Behavior: Behavior normal.        Thought Content: Thought content normal.        Judgment: Judgment normal.  UC Treatments / Results  Labs (all labs ordered are listed, but only abnormal results are displayed) Labs Reviewed - No data to display  EKG   Radiology No results found.  Procedures Procedures (including critical care time)  Medications Ordered in  UC Medications - No data to display  Initial Impression / Assessment and Plan / UC Course  I have reviewed the triage vital signs and the nursing notes. I reviewed her R knee xray report from 2012 and she had OA findings. I told pt more likely this is worse, and I sent her to [facility] to get treatment since we  dont do knee injections here. She was in agreement to go.  Final Clinical Impressions(s) / UC Diagnoses   Final diagnoses:  Chronic pain of right knee  Primary osteoarthritis of right knee     Discharge Instructions      Go to [facility] today so you can get an injection today     ED Prescriptions   None    PDMP not reviewed this encounter.   Shelby Mattocks, PA-C 08/31/21 1245

## 2021-08-31 NOTE — ED Triage Notes (Signed)
Pt c/o right knee pain "for a while" but it has been getting worse.. Pt states that she has trouble walking due to the pain.

## 2021-11-06 ENCOUNTER — Other Ambulatory Visit: Payer: Self-pay

## 2021-11-06 ENCOUNTER — Ambulatory Visit
Admission: EM | Admit: 2021-11-06 | Discharge: 2021-11-06 | Disposition: A | Discharge status: Home / Self Care | Payer: Medicare HMO | Attending: Emergency Medicine | Admitting: Emergency Medicine

## 2021-11-06 DIAGNOSIS — B3731 Acute candidiasis of vulva and vagina: Secondary | ICD-10-CM | POA: Diagnosis present

## 2021-11-06 DIAGNOSIS — R350 Frequency of micturition: Secondary | ICD-10-CM | POA: Insufficient documentation

## 2021-11-06 LAB — WET PREP, GENITAL
Clue Cells Wet Prep HPF POC: NONE SEEN
Sperm: NONE SEEN
Trich, Wet Prep: NONE SEEN
WBC, Wet Prep HPF POC: <10 — AB (ref <10)

## 2021-11-06 LAB — URINALYSIS, ROUTINE W REFLEX MICROSCOPIC
Bilirubin Urine: NEGATIVE
Glucose, UA: NEGATIVE mg/dL
Ketones, ur: NEGATIVE mg/dL
Leukocytes,Ua: NEGATIVE
Nitrite: NEGATIVE
Protein, ur: NEGATIVE mg/dL
Specific Gravity, Urine: 1.025 (ref 1.005–1.030)
pH: 5.5 (ref 5.0–8.0)

## 2021-11-06 LAB — URINALYSIS, MICROSCOPIC (REFLEX)

## 2021-11-06 MED ORDER — MICONAZOLE NITRATE 200 MG VA SUPP: 200.0000 mg | Freq: Every day | VAGINAL | 0 refills | Status: AC

## 2021-11-06 NOTE — ED Triage Notes (Signed)
Pt reports increase urinary frequency x 1 month.  ?

## 2021-11-06 NOTE — ED Provider Notes (Signed)
?[facility] ? ? ? ?CSN: [id number] ?Arrival date & time: 11/06/21  0920 ? ? ?  ? ?History   ?Chief Complaint ?Chief Complaint  ?Patient presents with  ? Urinary Frequency  ? ? ?HPI ?Hannah Robertson is a 81 y.o. female.  ? ?HPI ? ?81 year old female here for evaluation of urinary complaints. ? ?Patient reports that for the last month she has had increased urinary urgency and frequency.  She states that when she first gets out of bed in the morning she has to rush to the bathroom to void.  She states that she has had some episodes of incontinence which has been able to make it to the bathroom.  She does have low back pain at baseline but she denies any increase in her low back pain.  She also denies any pain with urination, blood in her urine, or fever.  No abdominal pain, vaginal itching, or vaginal discharge. ? ?Past Medical History:  ?Diagnosis Date  ? COVID-19 07/30/2019  ? Diabetes mellitus without complication (HCC)   ? GERD (gastroesophageal reflux disease)   ? H/O blood clots 2014  ? Hyperlipidemia   ? Hypertension   ? ? ?Patient Active Problem List  ? Diagnosis Date Noted  ? AKI (acute kidney injury) (HCC) 07/31/2019  ? Viral gastroenteritis 07/31/2019  ? Dehydration 07/31/2019  ? Insulin dependent type 2 diabetes mellitus (HCC) 07/31/2019  ? Essential hypertension 07/31/2019  ? Acquired hypothyroidism 07/31/2019  ? Chronic anticoagulation 07/31/2019  ? Hypotension due to hypovolemia 07/31/2019  ? Pneumonia due to COVID-19 virus 07/30/2019  ? Right flank pain 07/09/2014  ? ? ?Past Surgical History:  ?Procedure Laterality Date  ? ABDOMINAL HYSTERECTOMY    ? CESAREAN SECTION    ? CHOLECYSTECTOMY    ? COLONOSCOPY  11/27/13  ? ivc filter  2014  ? SHOULDER SURGERY    ? TONSILLECTOMY    ? ? ?OB History   ? ? Gravida  ?4  ? Para  ?2  ? Term  ?   ? Preterm  ?   ? AB  ?2  ? Living  ?3  ?  ? ? SAB  ?   ? IAB  ?2  ? Ectopic  ?   ? Multiple  ?1  ? Live Births  ?   ?   ?  ? Obstetric Comments  ?1st Menstrual  Cycle:  14 ?1st Pregnancy:  20 ?  ?  ? ?  ? ? ? ?Home Medications   ? ?Prior to Admission medications   ?Medication Sig Start Date End Date Taking? Authorizing Provider  ?miconazole (MICOTIN) 200 MG vaginal suppository Place 1 suppository (200 mg total) vaginally at bedtime for 3 days. 11/06/21 11/09/21 Yes Becky Augusta, NP  ?acetaminophen (TYLENOL) 500 MG tablet Take 500 mg by mouth 2 (two) times daily.    [provider]  ?amLODipine (NORVASC) 10 MG tablet Take 10 mg by mouth daily. 10/26/19   [provider]  ?LEVEMIR FLEXTOUCH 100 UNIT/ML Pen Inject 32 Units into the skin 2 (two) times daily before a meal. 05/06/19   [provider]  ?levothyroxine (SYNTHROID) 112 MCG tablet Take 112 mcg by mouth daily. 07/25/19   [provider]  ?losartan-hydrochlorothiazide (HYZAAR) 100-25 MG tablet Take 1 tablet by mouth daily. 06/12/19   [provider]  ?omeprazole (PRILOSEC) 20 MG capsule Take 20 mg by mouth daily.    [provider]  ?pravastatin (PRAVACHOL) 40 MG tablet Take 40 mg by mouth  daily.  04/07/14   [provider]  ?solifenacin (VESICARE) 5 MG tablet Take 5 mg by mouth daily. 08/10/20   [provider]  ?warfarin (COUMADIN) 2.5 MG tablet Take 3 tablets (7.5 mg total) by mouth daily at 6 PM. 08/04/19   Thomasenia BottomsAlam, Tawfikul, MD  ?albuterol (VENTOLIN HFA) 108 (90 Base) MCG/ACT inhaler Inhale 2 puffs into the lungs every 6 (six) hours. 08/04/19 09/21/20  Thomasenia BottomsAlam, Tawfikul, MD  ?cetirizine (ZYRTEC) 5 MG tablet Take 1 tablet (5 mg total) by mouth daily. 07/16/18 09/21/20  Tommie Samsook, Jayce G, DO  ?famotidine (PEPCID) 20 MG tablet Take 1 tablet (20 mg total) by mouth 2 (two) times daily. 07/16/18 09/21/20  Tommie Samsook, Jayce G, DO  ?fluticasone (FLONASE) 50 MCG/ACT nasal spray Place 2 sprays into both nostrils daily.  04/07/14 09/21/20  [provider]  ? ? ?Family History ?Family History  ?Problem Relation Age of Onset  ? Alzheimer's disease Mother   ? Hypertension Mother    ? Prostate cancer Father   ? Hypertension Father   ? ? ?Social History ?Social History  ? ?Tobacco Use  ? Smoking status: Never  ? Smokeless tobacco: Never  ?Vaping Use  ? Vaping Use: Never used  ?Substance Use Topics  ? Alcohol use: No  ?  Alcohol/week: 0.0 standard drinks  ? Drug use: No  ? ? ? ?Allergies   ?Gabapentin ? ? ?Review of Systems ?Review of Systems  ?Constitutional:  Negative for fever.  ?Genitourinary:  Positive for frequency and urgency. Negative for dysuria, hematuria, vaginal discharge and vaginal pain.  ?Musculoskeletal:  Positive for back pain.  ?Hematological: Negative.   ?Psychiatric/Behavioral: Negative.    ? ? ?Physical Exam ?Triage Vital Signs ?ED Triage Vitals  ?Enc Vitals Group  ?   BP 11/06/21 0933 (!) 144/60  ?   Pulse Rate 11/06/21 0933 63  ?   Resp 11/06/21 0933 18  ?   Temp 11/06/21 0933 98.9 ?F (37.2 ?C)  ?   Temp Source 11/06/21 0933 Oral  ?   SpO2 11/06/21 0933 98 %  ?   Weight --   ?   Height --   ?   Head Circumference --   ?   Peak Flow --   ?   Pain Score 11/06/21 0932 0  ?   Pain Loc --   ?   Pain Edu? --   ?   Excl. in GC? --   ? ?No data found. ? ?Updated Vital Signs ?BP (!) 144/60 (BP Location: Left Arm)   Pulse 63   Temp 98.9 ?F (37.2 ?C) (Oral)   Resp 18   SpO2 98%  ? ?Visual Acuity ?Right Eye Distance:   ?Left Eye Distance:   ?Bilateral Distance:   ? ?Right Eye Near:   ?Left Eye Near:    ?Bilateral Near:    ? ?Physical Exam ?Vitals and nursing note reviewed.  ?Constitutional:   ?   Appearance: Normal appearance. She is not ill-appearing.  ?HENT:  ?   Head: Normocephalic and atraumatic.  ?Cardiovascular:  ?   Rate and Rhythm: Normal rate and regular rhythm.  ?   Pulses: Normal pulses.  ?   Heart sounds: Normal heart sounds. No murmur heard. ?  No friction rub. No gallop.  ?Pulmonary:  ?   Effort: Pulmonary effort is normal.  ?   Breath sounds: Normal breath sounds. No wheezing, rhonchi or rales.  ?Abdominal:  ?   General: Abdomen is flat.  ?  Palpations: Abdomen is  soft.  ?   Tenderness: There is no abdominal tenderness. There is no right CVA tenderness or left CVA tenderness.  ?Skin: ?   General: Skin is warm and dry.  ?   Capillary Refill: Capillary refill takes less than 2 seconds.  ?   Findings: No erythema or rash.  ?Neurological:  ?   General: No focal deficit present.  ?   Mental Status: She is alert and oriented to person, place, and time.  ?Psychiatric:     ?   Mood and Affect: Mood normal.     ?   Behavior: Behavior normal.     ?   Thought Content: Thought content normal.     ?   Judgment: Judgment normal.  ? ? ? ?UC Treatments / Results  ?Labs ?(all labs ordered are listed, but only abnormal results are displayed) ?Labs Reviewed  ?WET PREP, GENITAL - Abnormal; Notable for the following components:  ?    Result Value  ? Yeast Wet Prep HPF POC PRESENT (*)   ? WBC, Wet Prep HPF POC <10 (*)   ? All other components within normal limits  ?URINALYSIS, ROUTINE W REFLEX MICROSCOPIC - Abnormal; Notable for the following components:  ? APPearance HAZY (*)   ? Hgb urine dipstick TRACE (*)   ? All other components within normal limits  ?URINALYSIS, MICROSCOPIC (REFLEX) - Abnormal; Notable for the following components:  ? Bacteria, UA RARE (*)   ? All other components within normal limits  ? ? ?EKG ? ? ?Radiology ?No results found. ? ?Procedures ?Procedures (including critical care time) ? ?Medications Ordered in UC ?Medications - No data to display ? ?Initial Impression / Assessment and Plan / UC Course  ?I have reviewed the triage vital signs and the nursing notes. ? ?Pertinent labs & imaging results that were available during my care of the patient were reviewed by me and considered in my medical decision making (see chart for details). ? ?Patient is a nontoxic-appearing 81 year old female here for evaluation of 1 month worth of urinary frequency and urgency that is not associated with dysuria.  She also denies hematuria, fever, abdominal pain, or vaginal complaints.  She has  low back pain at baseline which she reports causes sciatica in both of her legs.  She is receiving steroid injections for her low back pain.  She denies any increased over baseline.  Physical exam reveals a

## 2021-11-06 NOTE — Discharge Instructions (Addendum)
Your testing today did not reveal the presence of a urinary tract infection but it did reveal the presence of a vaginal yeast infection. ? ?Vaginal yeast infections can cause urinary urgency and frequency that you are experiencing. ? ?I am prescribing Monistat suppositories for treatment of your vaginal yeast infection.  Please insert 1 suppository in your vagina every night for the next 3 nights. ? ?If your symptoms continue please return for reevaluation or follow-up with your primary care provider. ?

## 2022-04-26 ENCOUNTER — Ambulatory Visit
Admission: EM | Admit: 2022-04-26 | Discharge: 2022-04-26 | Disposition: A | Discharge status: Home / Self Care | Payer: Medicare HMO | Attending: Physician Assistant | Admitting: Physician Assistant

## 2022-04-26 ENCOUNTER — Ambulatory Visit (INDEPENDENT_AMBULATORY_CARE_PROVIDER_SITE_OTHER): Payer: Medicare HMO

## 2022-04-26 DIAGNOSIS — R32 Unspecified urinary incontinence: Secondary | ICD-10-CM | POA: Diagnosis not present

## 2022-04-26 DIAGNOSIS — R509 Fever, unspecified: Secondary | ICD-10-CM | POA: Diagnosis not present

## 2022-04-26 DIAGNOSIS — E119 Type 2 diabetes mellitus without complications: Secondary | ICD-10-CM | POA: Insufficient documentation

## 2022-04-26 DIAGNOSIS — J029 Acute pharyngitis, unspecified: Secondary | ICD-10-CM | POA: Diagnosis not present

## 2022-04-26 DIAGNOSIS — R051 Acute cough: Secondary | ICD-10-CM | POA: Insufficient documentation

## 2022-04-26 DIAGNOSIS — Z20822 Contact with and (suspected) exposure to covid-19: Secondary | ICD-10-CM | POA: Diagnosis not present

## 2022-04-26 DIAGNOSIS — N3001 Acute cystitis with hematuria: Secondary | ICD-10-CM | POA: Diagnosis not present

## 2022-04-26 DIAGNOSIS — R059 Cough, unspecified: Secondary | ICD-10-CM | POA: Diagnosis not present

## 2022-04-26 DIAGNOSIS — K219 Gastro-esophageal reflux disease without esophagitis: Secondary | ICD-10-CM | POA: Diagnosis not present

## 2022-04-26 DIAGNOSIS — I10 Essential (primary) hypertension: Secondary | ICD-10-CM | POA: Insufficient documentation

## 2022-04-26 DIAGNOSIS — E785 Hyperlipidemia, unspecified: Secondary | ICD-10-CM | POA: Diagnosis not present

## 2022-04-26 DIAGNOSIS — R52 Pain, unspecified: Secondary | ICD-10-CM | POA: Insufficient documentation

## 2022-04-26 LAB — URINALYSIS, ROUTINE W REFLEX MICROSCOPIC
Bilirubin Urine: NEGATIVE
Glucose, UA: NEGATIVE mg/dL
Ketones, ur: NEGATIVE mg/dL
Nitrite: POSITIVE — AB
Specific Gravity, Urine: 1.01 (ref 1.005–1.030)
pH: 5.5 (ref 5.0–8.0)

## 2022-04-26 LAB — URINALYSIS, MICROSCOPIC (REFLEX): WBC, UA: >50 WBC/hpf (ref 0–5)

## 2022-04-26 LAB — GROUP A STREP BY PCR: Group A Strep by PCR: NOT DETECTED

## 2022-04-26 LAB — RESP PANEL BY RT-PCR (FLU A&B, COVID) ARPGX2
Influenza A by PCR: NEGATIVE
Influenza B by PCR: NEGATIVE
SARS Coronavirus 2 by RT PCR: NEGATIVE

## 2022-04-26 MED ORDER — ACETAMINOPHEN 500 MG PO TABS
500.0000 mg | ORAL_TABLET | Freq: Once | ORAL | Status: AC
  Administered 2022-04-26: 500 mg via ORAL

## 2022-04-26 MED ORDER — BENZONATATE 200 MG PO CAPS: 200.0000 mg | ORAL_CAPSULE | Freq: Three times a day (TID) | ORAL | 0 refills | Status: DC | PRN

## 2022-04-26 MED ORDER — CEFDINIR 300 MG PO CAPS: 300.0000 mg | ORAL_CAPSULE | Freq: Two times a day (BID) | ORAL | 0 refills | Status: AC

## 2022-04-26 NOTE — ED Triage Notes (Signed)
Pt c/o cough, temperature of 100.7, body aches, "spitting" x3days  Pt asks for a covid and flu test    Pt states that she had her thyroid removed 2-3 weeks ago.

## 2022-04-26 NOTE — Discharge Instructions (Addendum)
-  Your negative for strep, COVID and flu and you do not have pneumonia based on your chest x-ray. - You do have a urinary tract infection and I have sent antibiotics to pharmacy.  UTI: Based on either symptoms or urinalysis, you may have a urinary tract infection. We will send the urine for culture and call with results in a few days. Begin antibiotics at this time. Your symptoms should be much improved over the next 2-3 days. Increase rest and fluid intake. If for some reason symptoms are worsening or not improving after a couple of days or the urine culture determines the antibiotics you are taking will not treat the infection, the antibiotics may be changed. Return or go to ER for fever, back pain, worsening urinary pain, discharge, increased blood in urine. May take Tylenol or Motrin OTC for pain relief or consider AZO if no contraindications   URI/COLD SYMPTOMS: Your exam today is consistent with a viral illness. Antibiotics are not indicated at this time. Use medications as directed, including cough syrup, nasal saline, and decongestants. Your symptoms should improve over the next few days and resolve within 7-10 days. Increase rest and fluids. F/u if symptoms worsen or predominate such as sore throat, ear pain, productive cough, shortness of breath, or if you develop high fevers or worsening fatigue over the next several days.    -Please return or go to ER if you are not breaking the fever in the next 2 to 3 days or if you start to have chest pain, shortness of breath, abdominal pain, vomiting, significant diarrhea, weakness, severe headaches or very bad neck pain.

## 2022-04-26 NOTE — ED Provider Notes (Signed)
[facility]    CSN: [id number] Arrival date & time: 04/26/22  1121      History   Chief Complaint Chief Complaint  Patient presents with   Cough    HPI Hannah Robertson is a 81 y.o. female presenting for fever up to 101 degrees, body aches, sore throat, fatigue, cough that is productive over the past 3 days.  She says the cough is productive of clear to whitish sputum.  Denies pleuritic pain or wheezing.  She reports a mild headache.  No report of neck pain.  No report of chest pain.  Denies shortness of breath.  No abdominal pain, nausea/vomiting or diarrhea.  Reports urinary urgency, frequency and incontinence over the past couple of days but denies dysuria.  No report of swollen extremities or rashes.  Patient denies any sick contacts.  Of note she has a history of thyroid nodules and has had fine-needle biopsy 2 to 3 weeks ago.  Other medical history significant for diabetes, GERD, hypertension, hyperlipidemia.  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   Essential hypertension 07/31/2019   Acquired hypothyroidism 07/31/2019   Chronic anticoagulation 07/31/2019   Hypotension due to hypovolemia 07/31/2019   Pneumonia due to COVID-19 virus 07/30/2019   Right flank pain 07/09/2014    Past Surgical History:  Procedure Laterality Date   ABDOMINAL HYSTERECTOMY     CESAREAN SECTION     CHOLECYSTECTOMY     COLONOSCOPY  11/27/13   ivc filter  2014   SHOULDER SURGERY     TONSILLECTOMY      OB History     Gravida  4   Para  2   Term      Preterm      AB  2    Living  3      SAB      IAB  2   Ectopic      Multiple  1   Live Births           Obstetric Comments  1st Menstrual Cycle:  14 1st Pregnancy:  20           Home Medications    Prior to Admission medications   Medication Sig Start Date End Date Taking? Authorizing Provider  acetaminophen (TYLENOL) 500 MG tablet Take 500 mg by mouth 2 (two) times daily.   Yes [provider]  amLODipine (NORVASC) 10 MG tablet Take 10 mg by mouth daily. 10/26/19  Yes [provider]  benzonatate (TESSALON) 200 MG capsule Take 1 capsule (200 mg total) by mouth 3 (three) times daily as needed for cough. 04/26/22  Yes Eusebio Friendly B, PA-C  cefdinir (OMNICEF) 300 MG capsule Take 1 capsule (300 mg total) by mouth 2 (two) times daily for 7 days. 04/26/22 05/03/22 Yes Shirlee Latch, PA-C  LEVEMIR FLEXTOUCH 100 UNIT/ML Pen Inject 32 Units into the skin 2 (two) times daily before a meal. 05/06/19  Yes [provider]  levothyroxine (SYNTHROID) 112 MCG tablet Take 112 mcg by mouth daily. 07/25/19  Yes [provider]  losartan-hydrochlorothiazide (HYZAAR) 100-25 MG tablet Take 1 tablet by mouth daily. 06/12/19  Yes [provider]  omeprazole (PRILOSEC) 20 MG capsule Take 20 mg by mouth daily.   Yes [provider]  pravastatin (PRAVACHOL) 40 MG tablet Take 40 mg by mouth daily.  04/07/14  Yes [provider]  solifenacin (VESICARE) 5 MG tablet Take 5 mg by mouth daily. 08/10/20  Yes [provider]  warfarin (COUMADIN) 2.5 MG tablet Take 3 tablets (7.5 mg total) by mouth daily at 6 PM. 08/04/19  Yes Thomasenia Bottoms, MD  albuterol (VENTOLIN HFA) 108 (90 Base) MCG/ACT inhaler Inhale 2 puffs into the lungs every 6 (six) hours. 08/04/19 09/21/20  Thomasenia Bottoms, MD  cetirizine (ZYRTEC) 5 MG tablet Take 1 tablet (5 mg total) by mouth daily. 07/16/18 09/21/20  Tommie Sams, DO  famotidine (PEPCID) 20 MG tablet Take 1 tablet (20 mg total) by mouth  2 (two) times daily. 07/16/18 09/21/20  Tommie Sams, DO  fluticasone (FLONASE) 50 MCG/ACT nasal spray Place 2 sprays into both nostrils daily.  04/07/14 09/21/20  [provider]    Family History Family History  Problem Relation Age of Onset   Alzheimer's disease Mother    Hypertension Mother    Prostate cancer Father    Hypertension Father     Social History Social History   Tobacco Use   Smoking status: Never   Smokeless tobacco: Never  Vaping Use   Vaping Use: Never used  Substance Use Topics   Alcohol use: No    Alcohol/week: 0.0 standard drinks of alcohol   Drug use: No     Allergies   Gabapentin   Review of Systems Review of Systems  Constitutional:  Positive for fatigue and fever. Negative for chills and diaphoresis.  HENT:  Positive for congestion, rhinorrhea and sore throat. Negative for ear pain, sinus pressure and sinus pain.   Respiratory:  Positive for cough. Negative for shortness of breath.   Cardiovascular:  Negative for chest pain.  Gastrointestinal:  Positive for diarrhea. Negative for abdominal pain, nausea and vomiting.  Genitourinary:  Positive for enuresis, frequency and urgency. Negative for difficulty urinating and dysuria.  Musculoskeletal:  Positive for myalgias.  Skin:  Negative for rash.  Neurological:  Positive for headaches. Negative for dizziness and weakness.  Hematological:  Negative for adenopathy.     Physical Exam Triage Vital Signs ED Triage Vitals  Enc Vitals Group     BP      Pulse      Resp      Temp      Temp src      SpO2      Weight      Height      Head Circumference      Peak Flow      Pain Score      Pain Loc      Pain Edu?      Excl. in GC?    No data found.  Updated Vital Signs BP (!) 127/58   Pulse 66   Temp 99.5 F (37.5 C) (Oral)   Resp 18   Ht 5\' 1"  (1.549 m)   Wt 237 lb (107.5 kg)   SpO2 98%   BMI 44.78 kg/m   Physical Exam Vitals and nursing note reviewed.  Constitutional:       General: She is not in acute distress.    Appearance: Normal appearance. She is not ill-appearing or toxic-appearing.  HENT:     Head: Normocephalic and atraumatic.     Nose: Congestion present.     Mouth/Throat:     Mouth: Mucous membranes are moist.     Pharynx: Oropharynx is clear. Posterior oropharyngeal erythema present.  Eyes:     General: No scleral icterus.       Right eye: No discharge.        Left eye: No discharge.     Conjunctiva/sclera: Conjunctivae normal.  Cardiovascular:     Rate and Rhythm: Normal rate and regular rhythm.     Heart sounds: Normal heart sounds.  Pulmonary:     Effort: Pulmonary effort is normal. No respiratory distress.     Breath sounds: Normal breath sounds. No wheezing, rhonchi or rales.  Abdominal:     Palpations: Abdomen is soft.     Tenderness: There is no abdominal tenderness.  Musculoskeletal:     Cervical back: Neck supple.  Skin:    General: Skin is dry.  Neurological:     General: No focal deficit present.     Mental Status: She is alert and oriented to person, place, and time. Mental status is at baseline.     Motor: No weakness.     Gait: Gait (walks slow with cane) normal.  Psychiatric:        Mood and Affect: Mood normal.        Behavior: Behavior normal.        Thought Content: Thought content normal.      UC Treatments / Results  Labs (all labs ordered are listed, but only abnormal results are displayed) Labs Reviewed  URINALYSIS, ROUTINE W REFLEX MICROSCOPIC - Abnormal; Notable for the following components:      Result Value   APPearance HAZY (*)    Hgb urine dipstick TRACE (*)    Protein, ur TRACE (*)    Nitrite POSITIVE (*)    Leukocytes,Ua MODERATE (*)    All other components within normal limits  URINALYSIS, MICROSCOPIC (REFLEX) - Abnormal; Notable for the following components:   Bacteria, UA MANY (*)    All other components within normal limits  RESP PANEL BY RT-PCR (FLU A&B, COVID) [id number]  GROUP A  STREP BY PCR  URINE CULTURE    EKG   Radiology DG Chest 2 View  Result Date: 04/26/2022 CLINICAL DATA:  Cough and fever. 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 No evidence of lobar consolidation, pleural effusion or pneumothorax. Posttraumatic/osteoarthritic changes at the right acromioclavicular joint. IMPRESSION: 1. No active cardiopulmonary disease. 2. 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 Electronically Signed   By: Ted Mcalpineobrinka  Dimitrova M.D.   On: 04/26/2022 13:11    Procedures Procedures (including critical care time)  Medications Ordered in UC Medications  acetaminophen (TYLENOL) tablet 500 mg (500 mg Oral Given 04/26/22 1146)    Initial Impression / Assessment and Plan / UC Course  I have reviewed the triage vital signs and the nursing notes.  Pertinent labs & imaging results that were available during my care of the patient were reviewed by me and considered in my medical decision making (see chart for details).   81 year old female presenting for fever, fatigue, body aches, cough, congestion and sore throat x3 days.  Also reporting urinary frequency, urgency and incontinence.  Denying dysuria, abdominal pain or flank pain.  Temp in clinic is 101.4 degrees and BP elevated at 181/82.  She is ill-appearing but nontoxic.  Respiratory panel and PCR strep test obtained. Given 500 mg acetaminophen in clinic for fever.  She had 500 mg acetaminophen about 5 hours ago for fever.  Negative flu, COVID and strep.  Chest x-ray ordered to assess for possible pneumonia given fever and cough.  X-ray is negative for any acute abnormality.  She also was not reporting any pleuritic pain, shortness of breath or wheezing.  Urinalysis obtained to assess for possible UTI.  Vitals rechecked and blood pressure is 127/58.  Temp is 99.5 degrees.  UA shows hazy urine with  trace blood, protein, positive nitrites, moderate leukocytes, many bacteria and white blood cell clumps.  We will send urine for culture and treat for urinary tract infection with cefdinir.  This should also cover if patient is developing a sinusitis.   Suspect patient has viral influenza-like illness and urinary tract infection.  Encouraged her to increase her rest and fluids.  Sent benzonatate to pharmacy and cefdinir.  Advised taking Tylenol for fever as needed but she should be breaking the fever in the next 2 to 3 days.  Advised if she is not breaking the fever if she has any new or worsening symptoms including headache worsening, chest pain, dizziness, weakness, shortness of breath, abdominal pain, vomiting, diarrhea, flank pain, etc. she should seek immediate reevaluation either here or in the emergency department.  Patient is understanding and agreeable to plan.   Final Clinical Impressions(s) / UC Diagnoses   Final diagnoses:  Acute cystitis with hematuria  Fever, unspecified  Acute cough  Sore throat  Body aches     Discharge Instructions      -Your negative for strep, COVID and flu and you do not have pneumonia based on your chest x-ray. - You do have a urinary tract infection and I have sent antibiotics to pharmacy.  UTI: Based on either symptoms or urinalysis, you may have a urinary tract infection. We will send the urine for culture and call with results in a few days. Begin antibiotics at this time. Your symptoms should be much improved over the next 2-3 days. Increase rest and fluid intake. If for some reason symptoms are worsening or not improving after a couple of days or the urine culture determines the antibiotics you are taking will not treat the infection, the antibiotics may be changed. Return or go to ER for fever, back pain, worsening urinary pain, discharge, increased blood in urine. May take Tylenol or Motrin OTC for pain relief or consider AZO if no contraindications    URI/COLD SYMPTOMS: Your exam today is consistent with a viral illness. Antibiotics are not indicated at this time. Use medications as directed, including cough syrup, nasal saline, and decongestants. Your symptoms should improve over the next few days and resolve within 7-10 days. Increase rest and fluids. F/u if symptoms worsen or predominate such as sore throat, ear pain, productive cough, shortness of breath, or if you develop high fevers or worsening fatigue over the next several days.    -Please return or go to ER if you are not breaking the fever in the next 2 to 3 days or if you start to have chest pain, shortness of breath, abdominal pain, vomiting, significant diarrhea, weakness, severe headaches or very bad neck pain.     ED Prescriptions     Medication Sig Dispense Auth. Provider   cefdinir (OMNICEF) 300 MG capsule Take 1 capsule (300 mg total) by mouth 2 (two) times daily for  7 days. 14 capsule Laurene Footman B, PA-C   benzonatate (TESSALON) 200 MG capsule Take 1 capsule (200 mg total) by mouth 3 (three) times daily as needed for cough. 30 capsule Danton Clap, PA-C      PDMP not reviewed this encounter.   Danton Clap, PA-C 04/26/22 1400

## 2022-04-28 LAB — URINE CULTURE: Culture: >=100000 — AB

## 2022-05-09 ENCOUNTER — Ambulatory Visit
Admission: EM | Admit: 2022-05-09 | Discharge: 2022-05-09 | Disposition: A | Discharge status: Home / Self Care | Payer: Medicare HMO | Attending: Physician Assistant | Admitting: Physician Assistant

## 2022-05-09 DIAGNOSIS — N390 Urinary tract infection, site not specified: Secondary | ICD-10-CM | POA: Diagnosis present

## 2022-05-09 LAB — URINALYSIS, ROUTINE W REFLEX MICROSCOPIC
Glucose, UA: NEGATIVE mg/dL
Hgb urine dipstick: NEGATIVE
Leukocytes,Ua: NEGATIVE
Nitrite: NEGATIVE
Specific Gravity, Urine: 1.025 (ref 1.005–1.030)
pH: 5.5 (ref 5.0–8.0)

## 2022-05-09 LAB — URINALYSIS, MICROSCOPIC (REFLEX): RBC / HPF: NONE SEEN RBC/hpf (ref 0–5)

## 2022-05-09 NOTE — ED Provider Notes (Signed)
[facility]    CSN: [id number] Arrival date & time: 05/09/22  0920      History   Chief Complaint No chief complaint on file.   HPI Hannah Robertson is a 81 y.o. female presenting for urine recheck.  Patient was seen by me on 04/26/2022.  She returns today to have another urinalysis to ensure the urinary tract infection has resolved.  She did complete all antibiotics as prescribed.  She denies any dysuria, frequency, urgency or increased episodes of incontinence.  She says she is feeling well.  Has not had any fevers, abdominal pain, flank pain or hematuria.  No other concerns or complaints today.  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   Essential hypertension 07/31/2019   Acquired hypothyroidism 07/31/2019   Chronic anticoagulation 07/31/2019   Hypotension due to hypovolemia 07/31/2019   Pneumonia due to COVID-19 virus 07/30/2019   Right flank pain 07/09/2014    Past Surgical History:  Procedure Laterality Date   ABDOMINAL HYSTERECTOMY     CESAREAN SECTION     CHOLECYSTECTOMY     COLONOSCOPY  11/27/13   ivc filter  2014   SHOULDER SURGERY     TONSILLECTOMY      OB History     Gravida  4   Para  2   Term      Preterm      AB  2   Living  3      SAB      IAB  2   Ectopic      Multiple  1   Live Births           Obstetric Comments  1st Menstrual Cycle:  14 1st Pregnancy:  20           Home Medications    Prior to Admission medications   Medication Sig Start Date End Date Taking? Authorizing Provider  acetaminophen (TYLENOL)  500 MG tablet Take 500 mg by mouth 2 (two) times daily.   Yes [provider]  amLODipine (NORVASC) 10 MG tablet Take 10 mg by mouth daily. 10/26/19  Yes [provider]  LEVEMIR FLEXTOUCH 100 UNIT/ML Pen Inject 32 Units into the skin 2 (two) times daily before a meal. 05/06/19  Yes [provider]  levothyroxine (SYNTHROID) 112 MCG tablet Take 112 mcg by mouth daily. 07/25/19  Yes [provider]  losartan-hydrochlorothiazide (HYZAAR) 100-25 MG tablet Take 1 tablet by mouth daily. 06/12/19  Yes [provider]  omeprazole (PRILOSEC) 20 MG capsule Take 20 mg by mouth daily.   Yes [provider]  pravastatin (PRAVACHOL) 40 MG tablet Take 40 mg by mouth daily.  04/07/14  Yes [provider]  solifenacin (VESICARE) 5 MG tablet Take 5 mg by mouth daily. 08/10/20  Yes [provider]  warfarin (COUMADIN) 2.5 MG tablet Take 3 tablets (7.5 mg total) by mouth daily at 6 PM. 08/04/19  Yes Thomasenia Bottoms, MD  benzonatate (TESSALON) 200 MG capsule Take 1 capsule (200 mg total) by mouth 3 (three) times daily as  needed for cough. 04/26/22   Shirlee Latch, PA-C  albuterol (VENTOLIN HFA) 108 (90 Base) MCG/ACT inhaler Inhale 2 puffs into the lungs every 6 (six) hours. 08/04/19 09/21/20  Thomasenia Bottoms, MD  cetirizine (ZYRTEC) 5 MG tablet Take 1 tablet (5 mg total) by mouth daily. 07/16/18 09/21/20  Tommie Sams, DO  famotidine (PEPCID) 20 MG tablet Take 1 tablet (20 mg total) by mouth 2 (two) times daily. 07/16/18 09/21/20  Tommie Sams, DO  fluticasone (FLONASE) 50 MCG/ACT nasal spray Place 2 sprays into both nostrils daily.  04/07/14 09/21/20  [provider]    Family History Family History  Problem Relation Age of Onset   Alzheimer's disease Mother    Hypertension Mother    Prostate cancer Father    Hypertension Father     Social History Social History   Tobacco Use   Smoking status: Never   Smokeless tobacco: Never  Vaping  Use   Vaping Use: Never used  Substance Use Topics   Alcohol use: No    Alcohol/week: 0.0 standard drinks of alcohol   Drug use: No     Allergies   Gabapentin   Review of Systems Review of Systems  Constitutional:  Negative for chills, fatigue and fever.  Gastrointestinal:  Negative for abdominal pain, diarrhea, nausea and vomiting.  Genitourinary:  Negative for decreased urine volume, dysuria, flank pain, frequency, hematuria, pelvic pain, urgency, vaginal bleeding, vaginal discharge and vaginal pain.  Musculoskeletal:  Negative for back pain.  Skin:  Negative for rash.     Physical Exam Triage Vital Signs ED Triage Vitals  Enc Vitals Group     BP      Pulse      Resp      Temp      Temp src      SpO2      Weight      Height      Head Circumference      Peak Flow      Pain Score      Pain Loc      Pain Edu?      Excl. in GC?    No data found.  Updated Vital Signs BP (!) 158/75 (BP Location: Left Arm)   Pulse 61   Temp 98.7 F (37.1 C) (Oral)   Ht 5\' 1"  (1.549 m)   Wt 237 lb (107.5 kg)   SpO2 100%   BMI 44.78 kg/m      Physical Exam Vitals and nursing note reviewed.  Constitutional:      General: She is not in acute distress.    Appearance: Normal appearance. She is not ill-appearing or toxic-appearing.  HENT:     Head: Normocephalic and atraumatic.  Eyes:     General: No scleral icterus.       Right eye: No discharge.        Left eye: No discharge.     Conjunctiva/sclera: Conjunctivae normal.  Cardiovascular:     Rate and Rhythm: Normal rate and regular rhythm.  Pulmonary:     Effort: Pulmonary effort is normal. No respiratory distress.  Musculoskeletal:     Cervical back: Neck supple.  Skin:    General: Skin is dry.  Neurological:     General: No focal deficit present.     Mental Status: She is alert. Mental status is at baseline.     Motor: No weakness.     Gait: Gait normal.  Psychiatric:  Mood and Affect: Mood normal.         Behavior: Behavior normal.        Thought Content: Thought content normal.      UC Treatments / Results  Labs (all labs ordered are listed, but only abnormal results are displayed) Labs Reviewed  URINALYSIS, ROUTINE W REFLEX MICROSCOPIC - Abnormal; Notable for the following components:      Result Value   Bilirubin Urine SMALL (*)    Ketones, ur TRACE (*)    Protein, ur TRACE (*)    All other components within normal limits  URINALYSIS, MICROSCOPIC (REFLEX) - Abnormal; Notable for the following components:   Bacteria, UA RARE (*)    All other components within normal limits    EKG   Radiology No results found.  Procedures Procedures (including critical care time)  Medications Ordered in UC Medications - No data to display  Initial Impression / Assessment and Plan / UC Course  I have reviewed the triage vital signs and the nursing notes.  Pertinent labs & imaging results that were available during my care of the patient were reviewed by me and considered in my medical decision making (see chart for details).   81 year old female presenting for urinalysis.  She was seen by me on 04/26/2022 and had a UTI.  Patient also had a fever at that time but had cold symptoms.  She was negative for flu and COVID as well as strep.  She is not having any urinary symptoms or fevers.  She is feeling well and has no complaints at this time.  UA performed today without nitrites or leukocytes and rare bacteria seen on microscopic.  Urinalysis not consistent with UTI.  Discussed this with her.  Encouraged her to continue to stay hydrated and to return if she does develop any urinary symptoms or for any other acute issues.   Final Clinical Impressions(s) / UC Diagnoses   Final diagnoses:  Urinary tract infection without hematuria, site unspecified     Discharge Instructions      -No sign of UTI in your urine today. - Return if you do develop signs of UTI including fever, fatigue,  abdominal pain, back pain, hematuria which is blood in the urine, painful urination, urinary frequency or incontinence issues.     ED Prescriptions   None    PDMP not reviewed this encounter.   Danton Clap, PA-C 05/09/22 1115

## 2022-05-09 NOTE — ED Triage Notes (Signed)
Pt states she was seen on 04/26/22 & had a really bad urine infection. Pt states she was told to return to have her urine rechecked after finishing antibiotics. Pt denies any dysuria, no fevers

## 2022-05-09 NOTE — Discharge Instructions (Addendum)
-  No sign of UTI in your urine today. - Return if you do develop signs of UTI including fever, fatigue, abdominal pain, back pain, hematuria which is blood in the urine, painful urination, urinary frequency or incontinence issues.

## 2023-01-31 LAB — COLOGUARD: COLOGUARD: POSITIVE — AB

## 2023-01-31 LAB — EXTERNAL GENERIC LAB PROCEDURE: COLOGUARD: POSITIVE — AB

## 2023-06-10 ENCOUNTER — Ambulatory Visit
Admission: EM | Admit: 2023-06-10 | Discharge: 2023-06-10 | Disposition: A | Discharge status: Home / Self Care | Payer: Medicare PPO

## 2023-06-10 DIAGNOSIS — R110 Nausea: Secondary | ICD-10-CM

## 2023-06-10 DIAGNOSIS — K219 Gastro-esophageal reflux disease without esophagitis: Secondary | ICD-10-CM | POA: Diagnosis not present

## 2023-06-10 DIAGNOSIS — R1012 Left upper quadrant pain: Secondary | ICD-10-CM

## 2023-06-10 MED ORDER — PANTOPRAZOLE SODIUM 20 MG PO TBEC: 20.0000 mg | DELAYED_RELEASE_TABLET | Freq: Every day | ORAL | 0 refills | Status: DC

## 2023-06-10 MED ORDER — ONDANSETRON 4 MG PO TBDP: 4.0000 mg | ORAL_TABLET | Freq: Three times a day (TID) | ORAL | 0 refills | Status: DC | PRN

## 2023-06-10 NOTE — Discharge Instructions (Signed)
-  I sent a couple medications to the pharmacy.  1 helps to reduce stomach acid meds called pantoprazole.  The other is Zofran for nausea. - Increase rest and fluids and eat smaller meals.  Avoid greasy, fatty or spicy foods. - May take Tylenol for discomfort. - Try applying heat to your side in case some of this is muscle spasm and strain. - If you still have concerns about breast issue follow-up with your primary care provider as they would be the ones to order a mammogram.

## 2023-06-10 NOTE — ED Triage Notes (Signed)
Abdominal pain. When she eats she gets nauseas x 3 weeks. Takes tums with no relief. Baking soda water helps some. Pain under left breast ( sharp pain when she moves) x 3 days.

## 2023-06-10 NOTE — ED Provider Notes (Signed)
[facility]    CSN: [id number] Arrival date & time: 06/10/23  4098      History   Chief Complaint Chief Complaint  Patient presents with   Abdominal Pain   Breast Pain    HPI Hannah Robertson is a 82 y.o. female.   Patient reports nausea after eating "sometimes." Also reports a stomach "tightness for 10-15 minutes." States she has diarrhea soon after eating too. States baking soda helps. Patient says symptoms have been present "over a month." She says the "whole body hurts--head to toe."   Over the past 2-3 days she has been having pain of the left side. Pain is worse with movement such as rolling over at night. Denies injury or fall. She says she will occasionally feel a pain when she breathes. Also reports back pain that is similar and sharp. Eating/drinking does not worsen pain. No associated cough or shortness of breath. Denies breast soreness. History of right sided breast cancer.  Last mammogram was January of this year and she says it was clear.   Medical history significant for diabetes, GERD, hypertension, hyperlipidemia, and long term use of warfarin. Patient stopped taking omeprazole in May--unsure why. She was also on dicyclomine and Myrbetriq but says the meds did not helped so she stopped taking them. Patient says she mentioned these symptoms at her wellness physical 05/21/23.   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   Essential hypertension 07/31/2019   Acquired hypothyroidism 07/31/2019   Chronic anticoagulation 07/31/2019   Hypotension due to hypovolemia  07/31/2019   Pneumonia due to COVID-19 virus 07/30/2019   Right flank pain 07/09/2014    Past Surgical History:  Procedure Laterality Date   ABDOMINAL HYSTERECTOMY     CESAREAN SECTION     CHOLECYSTECTOMY     COLONOSCOPY  11/27/13   ivc filter  2014   SHOULDER SURGERY     TONSILLECTOMY      OB History     Gravida  4   Para  2   Term      Preterm      AB  2   Living  3      SAB      IAB  2   Ectopic      Multiple  1   Live Births           Obstetric Comments  1st Menstrual Cycle:  14 1st Pregnancy:  20           Home Medications    Prior to Admission medications   Medication Sig Start Date End Date Taking? Authorizing Provider  amLODipine (NORVASC) 10 MG tablet Take 10 mg by mouth daily. 10/26/19  Yes [provider]  insulin glargine, 1 Unit Dial, (TOUJEO SOLOSTAR) 300 UNIT/ML Solostar Pen Inject into the skin. 10/23/22 10/23/23 Yes [provider]  levothyroxine (SYNTHROID) 112 MCG tablet Take 112 mcg by mouth daily. 07/25/19  Yes [provider]  losartan-hydrochlorothiazide (HYZAAR) 100-25 MG tablet Take 1 tablet by mouth daily. 06/12/19  Yes [provider]  ondansetron (ZOFRAN-ODT) 4 MG disintegrating tablet Take 1 tablet (4 mg total) by mouth every 8 (eight) hours as needed for nausea. 06/10/23  Yes Eusebio Friendly B, PA-C  pantoprazole (PROTONIX) 20 MG tablet Take 1 tablet (20 mg total) by mouth daily. 06/10/23  Yes Eusebio Friendly B, PA-C  potassium chloride (KLOR-CON M) 10 MEQ tablet Take 1 tablet by mouth daily. 05/26/23 05/25/24 Yes [provider]  pravastatin (PRAVACHOL) 40 MG tablet Take 40 mg by mouth daily.  04/07/14  Yes [provider]  warfarin (COUMADIN) 2.5 MG tablet Take 3 tablets (7.5 mg total) by mouth daily at 6 PM. 08/04/19  Yes Thomasenia Bottoms, MD  acetaminophen (TYLENOL) 500 MG tablet Take 500 mg by mouth 2 (two) times daily.    [provider]  LEVEMIR FLEXTOUCH 100  UNIT/ML Pen Inject 32 Units into the skin 2 (two) times daily before a meal. 05/06/19   [provider]  solifenacin (VESICARE) 5 MG tablet Take 5 mg by mouth daily. 08/10/20   [provider]  albuterol (VENTOLIN HFA) 108 (90 Base) MCG/ACT inhaler Inhale 2 puffs into the lungs every 6 (six) hours. 08/04/19 09/21/20  Thomasenia Bottoms, MD  cetirizine (ZYRTEC) 5 MG tablet Take 1 tablet (5 mg total) by mouth daily. 07/16/18 09/21/20  Tommie Sams, DO  famotidine (PEPCID) 20 MG tablet Take 1 tablet (20 mg total) by mouth 2 (two) times daily. 07/16/18 09/21/20  Tommie Sams, DO  fluticasone (FLONASE) 50 MCG/ACT nasal spray Place 2 sprays into both nostrils daily.  04/07/14 09/21/20  [provider]    Family History Family History  Problem Relation Age of Onset   Alzheimer's disease Mother    Hypertension Mother    Prostate cancer Father    Hypertension Father     Social History Social History   Tobacco Use   Smoking status: Never   Smokeless tobacco: Never  Vaping Use   Vaping status: Never Used  Substance Use Topics   Alcohol use: No    Alcohol/week: 0.0 standard drinks of alcohol   Drug use: No     Allergies   Gabapentin   Review of Systems Review of Systems  Constitutional:  Negative for appetite change, fatigue and fever.  HENT:  Negative for congestion.   Respiratory:  Negative for cough, shortness of breath and wheezing.   Cardiovascular:  Negative for chest pain.  Gastrointestinal:  Positive for abdominal pain, diarrhea and nausea. Negative for vomiting.  Genitourinary:  Negative for difficulty urinating, flank pain and frequency.  Musculoskeletal:  Positive for arthralgias, back pain and myalgias.  Neurological:  Negative for dizziness, weakness and headaches.     Physical Exam Triage Vital Signs ED Triage Vitals  Encounter Vitals Group     BP 06/10/23 0906 (!) 174/92     Systolic BP Percentile --      Diastolic BP Percentile --      Pulse  Rate 06/10/23 0906 60     Resp 06/10/23 0906 17     Temp 06/10/23 0906 98.8 F (37.1 C)     Temp Source 06/10/23 0906 Oral     SpO2 06/10/23 0906 97 %     Weight --      Height --      Head Circumference --      Peak Flow --      Pain Score 06/10/23 0902 7     Pain Loc --      Pain Education --  Exclude from Growth Chart --    No data found.  Updated Vital Signs BP (!) 174/92 (BP Location: Left Arm)   Pulse 60   Temp 98.8 F (37.1 C) (Oral)   Resp 17   SpO2 97%    Physical Exam Vitals and nursing note reviewed.  Constitutional:      General: She is not in acute distress.    Appearance: Normal appearance. She is well-developed. She is obese. She is not ill-appearing or toxic-appearing.  HENT:     Head: Normocephalic and atraumatic.     Nose: Nose normal.     Mouth/Throat:     Mouth: Mucous membranes are moist.     Pharynx: Oropharynx is clear.  Eyes:     General: No scleral icterus.       Right eye: No discharge.        Left eye: No discharge.     Conjunctiva/sclera: Conjunctivae normal.  Cardiovascular:     Rate and Rhythm: Normal rate and regular rhythm.     Heart sounds: Normal heart sounds.  Pulmonary:     Effort: Pulmonary effort is normal. No respiratory distress.     Breath sounds: Normal breath sounds.  Chest:     Chest wall: Tenderness (left chest wall and lateral ribs/lat. No breast tenderness) present.  Abdominal:     Palpations: Abdomen is soft.     Tenderness: There is abdominal tenderness (LUQ, epigastric).  Musculoskeletal:     Cervical back: Neck supple.  Skin:    General: Skin is dry.  Neurological:     General: No focal deficit present.     Mental Status: She is alert. Mental status is at baseline.     Motor: No weakness.     Gait: Gait normal.  Psychiatric:        Mood and Affect: Mood normal.        Behavior: Behavior normal.        Thought Content: Thought content normal.      UC Treatments / Results  Labs (all labs  ordered are listed, but only abnormal results are displayed) Labs Reviewed - No data to display  EKG   Radiology No results found.  Procedures Procedures (including critical care time)  Medications Ordered in UC Medications - No data to display  Initial Impression / Assessment and Plan / UC Course  I have reviewed the triage vital signs and the nursing notes.  Pertinent labs & imaging results that were available during my care of the patient were reviewed by me and considered in my medical decision making (see chart for details).   82 year old female presents for nausea and left upper quadrant abdominal discomfort with diarrhea for the past month.  Patient was previously on omeprazole but stopped taking it because she said she did not think it worked.  She showed me her medication bottle which shows the last time she had this medication filled was in May.  Patient has a history of GERD.  Has tried baking soda which has helped her symptoms.  Patient is also reporting left sided rib discomfort and back pain.  Pain worse with twisting and rolling over in bed.  Denies any shortness of breath or pain on breathing.  Denies any pain of breast.  BP elevated at 174/92.  Other vitals normal and stable.  She is overall well-appearing.  No acute distress.  Has tenderness of epigastric region, left upper quadrant, left chest wall, left ribs and left thoracic area.  Chest clear to auscultation heart regular rate and rhythm.  Some of patient's symptoms are likely related to GERD.  She is no longer on an antacid.  I sent pantoprazole to pharmacy and encouraged her to consume smaller meals, increase rest and fluids.  Also sent Zofran for nausea.  Advise going to ED if the symptoms worsen before she can follow-up with her primary care provider.  We also discussed that some of her symptoms could be musculoskeletal.  Advised Tylenol, heat, ice, muscle rubs and stretching.  Patient reports she has an appointment  in a couple days with primary care provider and will mention her concerns about needing another mammogram.   Final Clinical Impressions(s) / UC Diagnoses   Final diagnoses:  Nausea without vomiting  Abdominal pain, left upper quadrant  Gastroesophageal reflux disease, unspecified whether esophagitis present     Discharge Instructions      -I sent a couple medications to the pharmacy.  1 helps to reduce stomach acid meds called pantoprazole.  The other is Zofran for nausea. - Increase rest and fluids and eat smaller meals.  Avoid greasy, fatty or spicy foods. - May take Tylenol for discomfort. - Try applying heat to your side in case some of this is muscle spasm and strain. - If you still have concerns about breast issue follow-up with your primary care provider as they would be the ones to order a mammogram.     ED Prescriptions     Medication Sig Dispense Auth. Provider   pantoprazole (PROTONIX) 20 MG tablet Take 1 tablet (20 mg total) by mouth daily. 90 tablet Eusebio Friendly B, PA-C   ondansetron (ZOFRAN-ODT) 4 MG disintegrating tablet Take 1 tablet (4 mg total) by mouth every 8 (eight) hours as needed for nausea. 20 tablet Gareth Morgan      PDMP not reviewed this encounter.   Shirlee Latch, PA-C 06/10/23 [phone]

## 2023-08-29 ENCOUNTER — Other Ambulatory Visit: Payer: Self-pay

## 2023-08-29 ENCOUNTER — Emergency Department: Payer: Medicare PPO

## 2023-08-29 ENCOUNTER — Emergency Department
Admission: EM | Admit: 2023-08-29 | Discharge: 2023-08-29 | Disposition: A | Discharge status: Home / Self Care | Payer: Medicare PPO | Attending: Emergency Medicine | Admitting: Emergency Medicine

## 2023-08-29 ENCOUNTER — Encounter: Payer: Self-pay | Admitting: Emergency Medicine

## 2023-08-29 DIAGNOSIS — R55 Syncope and collapse: Secondary | ICD-10-CM | POA: Insufficient documentation

## 2023-08-29 DIAGNOSIS — R519 Headache, unspecified: Secondary | ICD-10-CM | POA: Insufficient documentation

## 2023-08-29 DIAGNOSIS — Y92524 Gas station as the place of occurrence of the external cause: Secondary | ICD-10-CM | POA: Insufficient documentation

## 2023-08-29 DIAGNOSIS — Z7901 Long term (current) use of anticoagulants: Secondary | ICD-10-CM | POA: Insufficient documentation

## 2023-08-29 DIAGNOSIS — Z794 Long term (current) use of insulin: Secondary | ICD-10-CM | POA: Diagnosis not present

## 2023-08-29 DIAGNOSIS — M7989 Other specified soft tissue disorders: Secondary | ICD-10-CM | POA: Insufficient documentation

## 2023-08-29 DIAGNOSIS — S6991XA Unspecified injury of right wrist, hand and finger(s), initial encounter: Secondary | ICD-10-CM | POA: Insufficient documentation

## 2023-08-29 DIAGNOSIS — W19XXXA Unspecified fall, initial encounter: Secondary | ICD-10-CM

## 2023-08-29 DIAGNOSIS — W01198A Fall on same level from slipping, tripping and stumbling with subsequent striking against other object, initial encounter: Secondary | ICD-10-CM | POA: Diagnosis not present

## 2023-08-29 NOTE — ED Triage Notes (Signed)
First nurse note: [place] [facility] [place] [facility] [place] , tripped over rock. C/o pain all over.

## 2023-08-29 NOTE — Discharge Instructions (Signed)
Please take Tylenol for pain.  Use splint as directed.  Follow-up with orthopedics for further evaluation of wrist.  Follow-up with primary care for routine evaluation.  Return here for new or worse symptoms including worsening pain, numbness, weakness.

## 2023-08-29 NOTE — ED Provider Notes (Signed)
[place] EMERGENCY DEPARTMENT AT [facility] Provider Note   CSN: [id number] Arrival date & time: 08/29/23  1233     History  Chief Complaint  Patient presents with   Hannah Robertson is a 83 y.o. female.  Patient here for fall.  She had a mechanical fall earlier today she was at a convenience store when she tripped and fell she did hit her head as well as her right wrist.  She did have loss of consciousness.  Only for a few seconds per family.  She denies any numbness or weakness.  No bleeding.  No chest pain or trouble breathing at any point.   Fall Associated symptoms include headaches. Pertinent negatives include no chest pain and no abdominal pain.       Home Medications Prior to Admission medications   Medication Sig Start Date End Date Taking? Authorizing Provider  acetaminophen (TYLENOL) 500 MG tablet Take 500 mg by mouth 2 (two) times daily.    [provider]  amLODipine (NORVASC) 10 MG tablet Take 10 mg by mouth daily. 10/26/19   [provider]  insulin glargine, 1 Unit Dial, (TOUJEO SOLOSTAR) 300 UNIT/ML Solostar Pen Inject into the skin. 10/23/22 10/23/23  [provider]  LEVEMIR FLEXTOUCH 100 UNIT/ML Pen Inject 32 Units into the skin 2 (two) times daily before a meal. 05/06/19   [provider]  levothyroxine (SYNTHROID) 112 MCG tablet Take 112 mcg by mouth daily. 07/25/19   [provider]  losartan-hydrochlorothiazide (HYZAAR) 100-25 MG tablet Take 1 tablet by mouth daily. 06/12/19   [provider]  ondansetron (ZOFRAN-ODT) 4 MG disintegrating tablet Take 1 tablet (4 mg total) by mouth every 8 (eight) hours as needed for nausea. 06/10/23   Shirlee Latch, PA-C  pantoprazole (PROTONIX) 20 MG tablet Take 1 tablet (20 mg total) by mouth daily. 06/10/23   Eusebio Friendly B, PA-C  potassium chloride (KLOR-CON M) 10 MEQ tablet Take 1 tablet by mouth daily. 05/26/23 05/25/24  [provider]   pravastatin (PRAVACHOL) 40 MG tablet Take 40 mg by mouth daily.  04/07/14   [provider]  solifenacin (VESICARE) 5 MG tablet Take 5 mg by mouth daily. 08/10/20   [provider]  warfarin (COUMADIN) 2.5 MG tablet Take 3 tablets (7.5 mg total) by mouth daily at 6 PM. 08/04/19   Thomasenia Bottoms, MD  albuterol (VENTOLIN HFA) 108 (90 Base) MCG/ACT inhaler Inhale 2 puffs into the lungs every 6 (six) hours. 08/04/19 09/21/20  Thomasenia Bottoms, MD  cetirizine (ZYRTEC) 5 MG tablet Take 1 tablet (5 mg total) by mouth daily. 07/16/18 09/21/20  Tommie Sams, DO  famotidine (PEPCID) 20 MG tablet Take 1 tablet (20 mg total) by mouth 2 (two) times daily. 07/16/18 09/21/20  Tommie Sams, DO  fluticasone (FLONASE) 50 MCG/ACT nasal spray Place 2 sprays into both nostrils daily.  04/07/14 09/21/20  [provider]      Allergies    Gabapentin    Review of Systems   Review of Systems  Constitutional:  Negative for chills and fever.  Respiratory:  Negative for chest tightness.   Cardiovascular:  Negative for chest pain.  Gastrointestinal:  Negative for abdominal pain.  Musculoskeletal:  Positive for arthralgias and neck pain. Negative for back pain.  Skin:  Negative for rash and wound.  Neurological:  Positive for headaches.    Physical Exam Updated Vital Signs BP (!) 141/74 (BP Location: Left Arm)  Pulse 72   Temp 98.2 F (36.8 C) (Oral)   Resp 18   Ht 5\' 1"  (1.549 m)   Wt 110.7 kg   SpO2 98%   BMI 46.10 kg/m  Physical Exam Vitals and nursing note reviewed.  Constitutional:      General: She is not in acute distress.    Appearance: She is well-developed.  HENT:     Head: Normocephalic and atraumatic.  Eyes:     Conjunctiva/sclera: Conjunctivae normal.  Cardiovascular:     Rate and Rhythm: Normal rate and regular rhythm.     Heart sounds: No murmur heard. Pulmonary:     Effort: Pulmonary effort is normal. No respiratory distress.     Breath sounds: Normal breath  sounds.  Abdominal:     Palpations: Abdomen is soft.     Tenderness: There is no abdominal tenderness.  Musculoskeletal:        General: No swelling.     Cervical back: Neck supple.     Comments: Mild right wrist tenderness.  No anatomical snuffbox tenderness.  Grip strength intact.  F ROM.  Capillary refill less than 2 seconds.  Cervical F ROM.  Mild paraspinal tenderness.  No midline tenderness.  Skin:    General: Skin is warm and dry.     Capillary Refill: Capillary refill takes less than 2 seconds.  Neurological:     Mental Status: She is alert.  Psychiatric:        Mood and Affect: Mood normal.     ED Results / Procedures / Treatments   Labs (all labs ordered are listed, but only abnormal results are displayed) Labs Reviewed - No data to display  EKG None  Radiology DG Wrist Complete Right Result Date: 08/29/2023 CLINICAL DATA:  Pain status post fall. EXAM: RIGHT WRIST - COMPLETE 3+ VIEW COMPARISON:  None Available. FINDINGS: There is no evidence of acute fracture or dislocation. The carpal rows are intact and demonstrate normal alignment. Joint space narrowing of the radiocarpal, first CMC, and triscaphe articulations. Moderate joint space narrowing and marginal osteophytosis of the first interphalangeal joint. Vascular calcifications are noted. Soft tissue swelling of the wrist and hand. No radiopaque foreign body. IMPRESSION: 1. No acute osseous abnormality. 2. Degenerative changes, most pronounced at the first interphalangeal joint. Electronically Signed   By: Hart Robinsons M.D.   On: 08/29/2023 14:31   CT Head Wo Contrast Result Date: 08/29/2023 CLINICAL DATA:  Head trauma, minor (Age >= 65y); Neck trauma (Age >= 65y) EXAM: CT HEAD WITHOUT CONTRAST CT CERVICAL SPINE WITHOUT CONTRAST TECHNIQUE: Multidetector CT imaging of the head and cervical spine was performed following the standard protocol without intravenous contrast. Multiplanar CT image reconstructions of the  cervical spine were also generated. RADIATION DOSE REDUCTION: This exam was performed according to the departmental dose-optimization program which includes automated exposure control, adjustment of the mA and/or kV according to patient size and/or use of iterative reconstruction technique. COMPARISON:  None Available. FINDINGS: CT HEAD FINDINGS Brain: No hemorrhage. No hydrocephalus. No extra-axial fluid collection. No mass effect. No mass lesion. No CT evidence of an acute cortical infarct. There is a background of mild chronic microvascular ischemic change. Vascular: No hyperdense vessel or unexpected calcification. Skull: Normal. Negative for fracture or focal lesion. Sinuses/Orbits: No middle ear or mastoid effusion. Paranasal sinuses are clear. Orbits are unremarkable. Other: None. CT CERVICAL SPINE FINDINGS Alignment: Straightening of the normal cervical lordosis. Skull base and vertebrae: No acute fracture. No primary bone lesion or  focal pathologic process. Soft tissues and spinal canal: No prevertebral fluid or swelling. No visible canal hematoma. Disc levels:  No CT evidence of high-grade spinal canal stenosis. Upper chest: Negative. Other: None IMPRESSION: 1. No CT evidence of intracranial injury. 2. No acute fracture or traumatic subluxation of the cervical spine. Electronically Signed   By: Lorenza Cambridge M.D.   On: 08/29/2023 14:05   CT Cervical Spine Wo Contrast Result Date: 08/29/2023 CLINICAL DATA:  Head trauma, minor (Age >= 65y); Neck trauma (Age >= 65y) EXAM: CT HEAD WITHOUT CONTRAST CT CERVICAL SPINE WITHOUT CONTRAST TECHNIQUE: Multidetector CT imaging of the head and cervical spine was performed following the standard protocol without intravenous contrast. Multiplanar CT image reconstructions of the cervical spine were also generated. RADIATION DOSE REDUCTION: This exam was performed according to the departmental dose-optimization program which includes automated exposure control, adjustment  of the mA and/or kV according to patient size and/or use of iterative reconstruction technique. COMPARISON:  None Available. FINDINGS: CT HEAD FINDINGS Brain: No hemorrhage. No hydrocephalus. No extra-axial fluid collection. No mass effect. No mass lesion. No CT evidence of an acute cortical infarct. There is a background of mild chronic microvascular ischemic change. Vascular: No hyperdense vessel or unexpected calcification. Skull: Normal. Negative for fracture or focal lesion. Sinuses/Orbits: No middle ear or mastoid effusion. Paranasal sinuses are clear. Orbits are unremarkable. Other: None. CT CERVICAL SPINE FINDINGS Alignment: Straightening of the normal cervical lordosis. Skull base and vertebrae: No acute fracture. No primary bone lesion or focal pathologic process. Soft tissues and spinal canal: No prevertebral fluid or swelling. No visible canal hematoma. Disc levels:  No CT evidence of high-grade spinal canal stenosis. Upper chest: Negative. Other: None IMPRESSION: 1. No CT evidence of intracranial injury. 2. No acute fracture or traumatic subluxation of the cervical spine. Electronically Signed   By: Lorenza Cambridge M.D.   On: 08/29/2023 14:05    Procedures Procedures    Medications Ordered in ED Medications - No data to display  ED Course/ Medical Decision Making/ A&P                                 Medical Decision Making Patient here for follow-up.  Positive LOC.  No chest pain or shortness of breath.  This was a mechanical fall.  Hemodynamically stable overall well-appearing.  Patient does have some wrist tenderness.  X-rays overall unremarkable.  Neurovasculature intact.  I will recommend Tylenol for pain.  Splint.  Follow-up with orthopedics I recommended [facility] or [facility].  Head and neck CT are unremarkable patient does have a good range.  No actual midline tenderness.  I will recommend symptomatic treatment Tylenol.  Follow-up with primary.  Given return  precautions.  Amount and/or Complexity of Data Reviewed Radiology: ordered.           Final Clinical Impression(s) / ED Diagnoses Final diagnoses:  Fall, initial encounter  Injury of right wrist, initial encounter    Rx / DC Orders ED Discharge Orders     None         Christen Bame, [place] 08/29/23 1508    Concha Se, MD 08/30/23 [phone]

## 2023-08-29 NOTE — ED Triage Notes (Addendum)
Patient to ED via [facility] [place] [facility] after a [place]. Pt states she tripped over a rug. +LOC, does take blood thinners. C/o of pain all over and right wrist.

## 2024-04-21 ENCOUNTER — Ambulatory Visit (INDEPENDENT_AMBULATORY_CARE_PROVIDER_SITE_OTHER)

## 2024-04-21 ENCOUNTER — Ambulatory Visit: Admission: EM | Admit: 2024-04-21 | Discharge: 2024-04-21 | Disposition: A | Discharge status: Home / Self Care

## 2024-04-21 DIAGNOSIS — M25562 Pain in left knee: Secondary | ICD-10-CM

## 2024-04-21 DIAGNOSIS — M1712 Unilateral primary osteoarthritis, left knee: Secondary | ICD-10-CM

## 2024-04-21 MED ORDER — METHYLPREDNISOLONE 4 MG PO TBPK: ORAL_TABLET | ORAL | 0 refills | Status: AC

## 2024-04-21 MED ORDER — DEXAMETHASONE SODIUM PHOSPHATE 10 MG/ML IJ SOLN
10.0000 mg | Freq: Once | INTRAMUSCULAR | Status: AC
Start: 2024-04-21 — End: 2024-04-21
  Administered 2024-04-21: 10 mg via INTRAMUSCULAR

## 2024-04-21 NOTE — Discharge Instructions (Addendum)
 Your x-rays do not show any evidence of broken or dislocated bones.  You do have degenerative changes, especially on the inside of your knee.  I do believe that this degeneration has caused a flare which is causing your pain.  We have given you an injection of steroids here in clinic to help decrease inflammation and aid in pain relief.  I am also prescribe you a low-dose steroid pack that you are to start tomorrow morning at breakfast.  You may continue using Tylenol  as well for pain.  Keep your left knee elevated is much as possible to help decrease swelling and aid in pain relief.  You may apply ice to your knee for 20 minutes at a time, 2-3 times a day, to help with pain and inflammation.  Follow the home physical therapy exercises provided in your discharge instructions.  Wear the hinged knee brace when you are up and moving around.  This will support your knee to prevent further injury and will also aid in pain relief.  If you do not have any improvement in pain in the next week to 10 days I would recommend following up with [facility] orthopedics.  Call to make an appointment.

## 2024-04-21 NOTE — ED Triage Notes (Signed)
 Pt c/o L knee pain x1 day. States was in the bathroom, was trying to get up & heard pop. Denies any falls. Has tried tylenol  w/o relief.

## 2024-04-21 NOTE — ED Provider Notes (Addendum)
 [facility]    CSN: [id number] Arrival date & time: 04/21/24  0835      History   Chief Complaint Chief Complaint  Hannah Robertson presents with   Knee Pain    HPI Hannah Robertson is a 83 y.o. female.   HPI  83 year old female with past medical history significant for diabetes, GERD, essential hypertension, acquired hypothyroidism, hyperlipidemia, and PE presents for left knee pain.  She reports that she was getting up from the toilet and she felt several pops in her left knee and she has had pain with weightbearing ever since.  No swelling or bruising noted.  No falls or injuries.  Past Medical History:  Diagnosis Date   COVID-19 07/30/2019   Diabetes mellitus without complication (HCC)    GERD (gastroesophageal reflux disease)    H/O blood clots 2014   Hyperlipidemia    Hypertension     Hannah Robertson Active Problem List   Diagnosis Date Noted   AKI (acute kidney injury) (HCC) 07/31/2019   Viral gastroenteritis 07/31/2019   Dehydration 07/31/2019   Insulin  dependent type 2 diabetes mellitus (HCC) 07/31/2019   Essential hypertension 07/31/2019   Acquired hypothyroidism 07/31/2019   Chronic anticoagulation 07/31/2019   Hypotension due to hypovolemia 07/31/2019   Pneumonia due to COVID-19 virus 07/30/2019   Right flank pain 07/09/2014    Past Surgical History:  Procedure Laterality Date   ABDOMINAL HYSTERECTOMY     CESAREAN SECTION     CHOLECYSTECTOMY     COLONOSCOPY  11/27/13   ivc filter  2014   SHOULDER SURGERY     TONSILLECTOMY      OB History     Gravida  4   Para  2   Term      Preterm      AB  2   Living  3      SAB      IAB  2   Ectopic      Multiple  1   Live Births           Obstetric Comments  1st Menstrual Cycle:  14 1st Pregnancy:  20           Home Medications    Prior to Admission medications   Medication Sig Start Date End Date Taking? Authorizing Provider  loperamide (IMODIUM A-D) 2 MG tablet Take 2  tablets (4 mg total) by mouth Three (3) times a day as needed for diarrhea. 03/24/24 03/24/25 Yes [provider]  methylPREDNISolone  (MEDROL  DOSEPAK) 4 MG TBPK tablet Take according to the package insert. 04/21/24  Yes Bernardino Ditch, NP  omeprazole (PRILOSEC) 20 MG capsule Take 20 mg by mouth daily. 12/09/23 12/08/24 Yes [provider]  acetaminophen  (TYLENOL ) 500 MG tablet Take 500 mg by mouth 2 (two) times daily.    [provider]  amLODipine (NORVASC) 10 MG tablet Take 10 mg by mouth daily. 10/26/19   [provider]  LEVEMIR FLEXTOUCH 100 UNIT/ML Pen Inject 32 Units into the skin 2 (two) times daily before a meal. 05/06/19   [provider]  levothyroxine  (SYNTHROID ) 112 MCG tablet Take 112 mcg by mouth daily. 07/25/19   [provider]  losartan-hydrochlorothiazide (HYZAAR) 100-25 MG tablet Take 1 tablet by mouth daily. 06/12/19   [provider]  potassium chloride (KLOR-CON M) 10 MEQ tablet Take 1 tablet by mouth daily. 05/26/23 05/25/24  [provider]  pravastatin (PRAVACHOL) 40 MG tablet Take 40 mg by mouth daily.  04/07/14  [provider]  solifenacin (VESICARE) 5 MG tablet Take 5 mg by mouth daily. 08/10/20   [provider]  warfarin (COUMADIN ) 2.5 MG tablet Take 3 tablets (7.5 mg total) by mouth daily at 6 PM. 08/04/19   Bertell Denise, MD  albuterol  (VENTOLIN  HFA) 108 (90 Base) MCG/ACT inhaler Inhale 2 puffs into the lungs every 6 (six) hours. 08/04/19 09/21/20  Bertell Denise, MD  cetirizine  (ZYRTEC ) 5 MG tablet Take 1 tablet (5 mg total) by mouth daily. 07/16/18 09/21/20  Cook, Jayce G, DO  famotidine  (PEPCID ) 20 MG tablet Take 1 tablet (20 mg total) by mouth 2 (two) times daily. 07/16/18 09/21/20  Cook, Jayce G, DO  fluticasone (FLONASE) 50 MCG/ACT nasal spray Place 2 sprays into both nostrils daily.  04/07/14 09/21/20  [provider]    Family History Family History  Problem Relation Age of  Onset   Alzheimer's disease Mother    Hypertension Mother    Prostate cancer Father    Hypertension Father     Social History Social History   Tobacco Use   Smoking status: Never   Smokeless tobacco: Never  Vaping Use   Vaping status: Never Used  Substance Use Topics   Alcohol use: No    Alcohol/week: 0.0 standard drinks of alcohol   Drug use: No     Allergies   Gabapentin   Review of Systems Review of Systems  Musculoskeletal:  Positive for arthralgias. Negative for joint swelling.  Skin:  Negative for color change.     Physical Exam Triage Vital Signs ED Triage Vitals  Encounter Vitals Group     BP      Girls Systolic BP Percentile      Girls Diastolic BP Percentile      Boys Systolic BP Percentile      Boys Diastolic BP Percentile      Pulse      Resp      Temp      Temp src      SpO2      Weight      Height      Head Circumference      Peak Flow      Pain Score      Pain Loc      Pain Education      Exclude from Growth Chart    No data found.  Updated Vital Signs BP 136/75 (BP Location: Right Arm)   Pulse 66   Temp 99 F (37.2 C) (Oral)   Resp 16   Ht 5' 1 (1.549 m)   Wt 240 lb (108.9 kg)   SpO2 96%   BMI 45.35 kg/m   Visual Acuity Right Eye Distance:   Left Eye Distance:   Bilateral Distance:    Right Eye Near:   Left Eye Near:    Bilateral Near:     Physical Exam Vitals and nursing note reviewed.  Constitutional:      Appearance: Normal appearance. She is not ill-appearing.  HENT:     Head: Normocephalic and atraumatic.  Musculoskeletal:        General: Tenderness present. No swelling or signs of injury.  Skin:    General: Skin is warm and dry.     Capillary Refill: Capillary refill takes less than 2 seconds.     Findings: No bruising or erythema.  Neurological:     General: No focal deficit present.     Mental Status: She is alert and oriented to  person, place, and time.      UC Treatments / Results  Labs (all  labs ordered are listed, but only abnormal results are displayed) Labs Reviewed - No data to display  EKG   Radiology No results found.  Procedures Procedures (including critical care time)  Medications Ordered in UC Medications  dexamethasone  (DECADRON ) injection 10 mg (has no administration in time range)    Initial Impression / Assessment and Plan / UC Course  I have reviewed the triage vital signs and the nursing notes.  Pertinent labs & imaging results that were available during my care of the Hannah Robertson were reviewed by me and considered in my medical decision making (see chart for details).   Hannah Robertson is a nontoxic-appearing 83 year old female presenting for evaluation of acute onset left knee pain as outlined in HPI above.  In the exam room her knee is in normal anatomical alignment and free of edema, erythema, or ecchymosis.  She is reporting pain in the anterior aspect of her knee just below her patella.  She has no tenderness with palpation of the patella, quadriceps complex, medial or lateral joint line, popliteal space, or over the tibial tuberosity.  The Hannah Robertson's pain is primarily over the distal aspect of the patella tendon.  I suspect that this is soft tissue in nature.  I will obtain a radiograph of the left knee to rule out any bony abnormality.  Left knee x-rays independently reviewed and evaluated by me.  Impression: Degenerative changes throughout the medial and lateral compartment.  Medial compartment appears to be bone-on-bone.  No acute fracture or dislocation.  Soft tissues are unremarkable.  Atherosclerotic vessels are present in the lateral view.  Radiology overread is pending. Radiology report states progressive medial compartment osteoarthritis.  No other acute osseous findings.  I will discharge Hannah Robertson home with a diagnosis of osteoarthritis of her left knee.  I will have staff fit her with a hinged knee brace for support.  Due to the fact that she is on chronic  anticoagulation secondary to blood clots that she is not a candidate for NSAIDs.  I will offer her 10 mg of IM Decadron  here in clinic and discharge her home on a Medrol  Dosepak to decrease inflammation in her knee and aid in pain relief.  If her pain continues I will recommend that she follow-up with orthopedics.  She has seen [facility] at [employer] and she can follow-up with them as needed.   Final Clinical Impressions(s) / UC Diagnoses   Final diagnoses:  Acute pain of left knee  Osteoarthritis of left knee, unspecified osteoarthritis type     Discharge Instructions      Your x-rays do not show any evidence of broken or dislocated bones.  You do have degenerative changes, especially on the inside of your knee.  I do believe that this degeneration has caused a flare which is causing your pain.  We have given you an injection of steroids here in clinic to help decrease inflammation and aid in pain relief.  I am also prescribe you a low-dose steroid pack that you are to start tomorrow morning at breakfast.  You may continue using Tylenol  as well for pain.  Keep your left knee elevated is much as possible to help decrease swelling and aid in pain relief.  You may apply ice to your knee for 20 minutes at a time, 2-3 times a day, to help with pain and inflammation.  Follow the home physical therapy exercises  provided in your discharge instructions.  Wear the hinged knee brace when you are up and moving around.  This will support your knee to prevent further injury and will also aid in pain relief.  If you do not have any improvement in pain in the next week to 10 days I would recommend following up with [facility] orthopedics.  Call to make an appointment.     ED Prescriptions     Medication Sig Dispense Auth. Provider   methylPREDNISolone  (MEDROL  DOSEPAK) 4 MG TBPK tablet Take according to the package insert. 1 each Bernardino Ditch, NP      PDMP not reviewed this encounter.    Bernardino Ditch, NP 04/21/24 1047    Bernardino Ditch, NP 04/21/24 1053

## 2024-06-21 ENCOUNTER — Ambulatory Visit
Admission: EM | Admit: 2024-06-21 | Discharge: 2024-06-21 | Disposition: A | Discharge status: Home / Self Care | Attending: Nurse Practitioner | Admitting: Nurse Practitioner

## 2024-06-21 DIAGNOSIS — R399 Unspecified symptoms and signs involving the genitourinary system: Secondary | ICD-10-CM | POA: Diagnosis not present

## 2024-06-21 DIAGNOSIS — M5431 Sciatica, right side: Secondary | ICD-10-CM | POA: Diagnosis not present

## 2024-06-21 LAB — POCT URINE DIPSTICK
Blood, UA: NEGATIVE
Glucose, UA: NEGATIVE mg/dL
Nitrite, UA: POSITIVE — AB
POC PROTEIN,UA: 30 — AB
Spec Grav, UA: 1.02 (ref 1.010–1.025)
Urobilinogen, UA: 0.2 U/dL
pH, UA: 5 (ref 5.0–8.0)

## 2024-06-21 MED ORDER — CEPHALEXIN 500 MG PO CAPS: 500.0000 mg | ORAL_CAPSULE | Freq: Two times a day (BID) | ORAL | 0 refills | Status: AC

## 2024-06-21 MED ORDER — METHYLPREDNISOLONE ACETATE 40 MG/ML IJ SUSP
40.0000 mg | Freq: Once | INTRAMUSCULAR | Status: AC
  Administered 2024-06-21: 40 mg via INTRAMUSCULAR

## 2024-06-21 MED ORDER — TIZANIDINE HCL 2 MG PO TABS: 2.0000 mg | ORAL_TABLET | Freq: Three times a day (TID) | ORAL | 0 refills | Status: AC | PRN

## 2024-06-21 NOTE — ED Provider Notes (Signed)
 [facility]    CSN: [id number] Arrival date & time: 06/21/24  0855      History   Chief Complaint Chief Complaint  Patient presents with   Back Pain   Leg Pain    HPI Hannah Robertson is a 83 y.o. female.   Patient presents today with 1 week history of right low back pain shooting down the right leg.  She denies recent fall, trauma, or injury to the right back or leg.  No new urinary incontinence, urinary frequency or urgency, burning with urination, lower extremity weakness, or decree sensation of lower extremities.  No new numbness or tingling in the toes.  She reports history of diabetes, this morning her blood sugar was 89 per her report.  No fevers or nausea/vomiting.  She has taken Tylenol  for the pain without any improvement.  Medical history significant for osteoarthritis of the right knee, type 2 diabetes on insulin , chronic anticoagulation for IVC filter.    Past Medical History:  Diagnosis Date   COVID-19 07/30/2019   Diabetes mellitus without complication (HCC)    GERD (gastroesophageal reflux disease)    H/O blood clots 2014   Hyperlipidemia    Hypertension     Patient Active Problem List   Diagnosis Date Noted   AKI (acute kidney injury) 07/31/2019   Viral gastroenteritis 07/31/2019   Dehydration 07/31/2019   Insulin  dependent type 2 diabetes mellitus (HCC) 07/31/2019   Essential hypertension 07/31/2019   Acquired hypothyroidism 07/31/2019   Chronic anticoagulation 07/31/2019   Hypotension due to hypovolemia 07/31/2019   Pneumonia due to COVID-19 virus 07/30/2019   Right flank pain 07/09/2014    Past Surgical History:  Procedure Laterality Date   ABDOMINAL HYSTERECTOMY     CESAREAN SECTION     CHOLECYSTECTOMY     COLONOSCOPY  11/27/13   ivc filter  2014   SHOULDER SURGERY     TONSILLECTOMY      OB History     Gravida  4   Para  2   Term      Preterm      AB  2   Living  3      SAB      IAB  2   Ectopic       Multiple  1   Live Births           Obstetric Comments  1st Menstrual Cycle:  14 1st Pregnancy:  20           Home Medications    Prior to Admission medications   Medication Sig Start Date End Date Taking? Authorizing Provider  amLODipine (NORVASC) 10 MG tablet Take 10 mg by mouth daily. 10/26/19  Yes [provider]  cephALEXin  (KEFLEX ) 500 MG capsule Take 1 capsule (500 mg total) by mouth 2 (two) times daily for 5 days. 06/21/24 06/26/24 Yes Chandra Raisin A, NP  ELIQUIS 2.5 MG TABS tablet TAKE 1 TABLET TWICE DAILY (DISCONTINUE WARFARIN) 04/30/24  Yes [provider]  levothyroxine  (SYNTHROID ) 112 MCG tablet Take 112 mcg by mouth daily. 07/25/19  Yes [provider]  losartan-hydrochlorothiazide (HYZAAR) 100-25 MG tablet Take 1 tablet by mouth daily. 06/12/19  Yes [provider]  MYRBETRIQ 25 MG TB24 tablet Take 25 mg by mouth daily. 05/19/24  Yes [provider]  pravastatin (PRAVACHOL) 40 MG tablet Take 40 mg by mouth daily.  04/07/14  Yes [provider]  tiZANidine (ZANAFLEX) 2 MG tablet Take 1 tablet (2  mg total) by mouth every 8 (eight) hours as needed for muscle spasms. 06/21/24  Yes Chandra Harlene LABOR, NP  TOUJEO SOLOSTAR 300 UNIT/ML Solostar Pen Inject into the skin.   Yes [provider]  acetaminophen  (TYLENOL ) 500 MG tablet Take 500 mg by mouth 2 (two) times daily.    [provider]  LEVEMIR FLEXTOUCH 100 UNIT/ML Pen Inject 32 Units into the skin 2 (two) times daily before a meal. 05/06/19   [provider]  loperamide (IMODIUM A-D) 2 MG tablet Take 2 tablets (4 mg total) by mouth Three (3) times a day as needed for diarrhea. 03/24/24 03/24/25  [provider]  methylPREDNISolone  (MEDROL  DOSEPAK) 4 MG TBPK tablet Take according to the package insert. 04/21/24   Bernardino Ditch, NP  omeprazole (PRILOSEC) 20 MG capsule Take 20 mg by mouth daily. 12/09/23 12/08/24  [provider]   potassium chloride (KLOR-CON M) 10 MEQ tablet Take 1 tablet by mouth daily. 05/26/23 05/25/24  [provider]  solifenacin (VESICARE) 5 MG tablet Take 5 mg by mouth daily. 08/10/20   [provider]  warfarin (COUMADIN ) 2.5 MG tablet Take 3 tablets (7.5 mg total) by mouth daily at 6 PM. 08/04/19   Bertell Denise, MD  albuterol  (VENTOLIN  HFA) 108 (90 Base) MCG/ACT inhaler Inhale 2 puffs into the lungs every 6 (six) hours. 08/04/19 09/21/20  Bertell Denise, MD  cetirizine  (ZYRTEC ) 5 MG tablet Take 1 tablet (5 mg total) by mouth daily. 07/16/18 09/21/20  Cook, Jayce G, DO  famotidine  (PEPCID ) 20 MG tablet Take 1 tablet (20 mg total) by mouth 2 (two) times daily. 07/16/18 09/21/20  Cook, Jayce G, DO  fluticasone (FLONASE) 50 MCG/ACT nasal spray Place 2 sprays into both nostrils daily.  04/07/14 09/21/20  [provider]    Family History Family History  Problem Relation Age of Onset   Alzheimer's disease Mother    Hypertension Mother    Prostate cancer Father    Hypertension Father     Social History Social History   Tobacco Use   Smoking status: Never   Smokeless tobacco: Never  Vaping Use   Vaping status: Never Used  Substance Use Topics   Alcohol use: No    Alcohol/week: 0.0 standard drinks of alcohol   Drug use: No     Allergies   Gabapentin   Review of Systems Review of Systems Per HPI  Physical Exam Triage Vital Signs ED Triage Vitals  Encounter Vitals Group     BP 06/21/24 0910 (!) 142/82     Girls Systolic BP Percentile --      Girls Diastolic BP Percentile --      Boys Systolic BP Percentile --      Boys Diastolic BP Percentile --      Pulse Rate 06/21/24 0910 69     Resp 06/21/24 0910 18     Temp 06/21/24 0910 98.9 F (37.2 C)     Temp Source 06/21/24 0910 Oral     SpO2 06/21/24 0910 94 %     Weight 06/21/24 0907 233 lb (105.7 kg)     Height --      Head Circumference --      Peak Flow --      Pain Score 06/21/24 0906 10      Pain Loc --      Pain Education --      Exclude from Growth Chart --    No data found.  Updated Vital Signs  BP (!) 142/82 (BP Location: Left Arm)   Pulse 69   Temp 98.9 F (37.2 C) (Oral)   Resp 18   Wt 233 lb (105.7 kg)   SpO2 94%   BMI 44.02 kg/m   Visual Acuity Right Eye Distance:   Left Eye Distance:   Bilateral Distance:    Right Eye Near:   Left Eye Near:    Bilateral Near:     Physical Exam Vitals and nursing note reviewed.  Constitutional:      General: She is not in acute distress.    Appearance: Normal appearance. She is not toxic-appearing.  HENT:     Mouth/Throat:     Mouth: Mucous membranes are moist.     Pharynx: Oropharynx is clear.  Pulmonary:     Effort: Pulmonary effort is normal. No respiratory distress.  Abdominal:     Tenderness: There is no right CVA tenderness or left CVA tenderness.  Musculoskeletal:     Comments: Inspection: no swelling, bruising, obvious deformity or redness Palpation: tender to palpation right SI joint; no obvious deformities palpated ROM: difficult to assess secondary to body habitus and pain Strength: difficult to assess secondary to pain Neurovascular: neurovascularly intact in distal bilateral lower extremities   Skin:    General: Skin is warm and dry.     Capillary Refill: Capillary refill takes less than 2 seconds.     Coloration: Skin is not jaundiced or pale.     Findings: No erythema.  Neurological:     Mental Status: She is alert and oriented to person, place, and time.     Gait: Gait abnormal (walks with cane - baseline).  Psychiatric:        Behavior: Behavior is cooperative.      UC Treatments / Results  Labs (all labs ordered are listed, but only abnormal results are displayed) Labs Reviewed  POCT URINE DIPSTICK - Abnormal; Notable for the following components:      Result Value   Color, UA orange (*)    Bilirubin, UA small (*)    Ketones, POC UA trace (5) (*)    POC PROTEIN,UA =30 (*)     Nitrite, UA Positive (*)    Leukocytes, UA Trace (*)    All other components within normal limits  URINE CULTURE    EKG   Radiology No results found.  Procedures Procedures (including critical care time)  Medications Ordered in UC Medications  methylPREDNISolone  acetate (DEPO-MEDROL ) injection 40 mg (40 mg Intramuscular Given 06/21/24 0938)    Initial Impression / Assessment and Plan / UC Course  I have reviewed the triage vital signs and the nursing notes.  Pertinent labs & imaging results that were available during my care of the patient were reviewed by me and considered in my medical decision making (see chart for details).   In triage, patient is mildly hypertensive, otherwise vital signs are stable.  She is in no acute distress.  On exam, she appears to have sciatica of the right side.  Pain was treated with Depo-Medrol  40 mg IM in urgent care today, encouraged to monitor blood sugars closely given history of type 2 diabetes on insulin .  In addition, will start tizanidine 2 mg every 8 hours as needed for muscular pain.  Recommended close follow-up with primary care provider.  Strict ER precautions discussed with the patient.  The patient was given the opportunity to ask questions.  All questions answered to their satisfaction.  The patient is in agreement  to this plan.   Final Clinical Impressions(s) / UC Diagnoses   Final diagnoses:  Sciatica of right side  UTI symptoms     Discharge Instructions      The pain in your right side is consistent with sciatic back pain.  Recommend light range of motion and stretching exercises and start the muscle relaxant medication we have prescribed for you today.  We gave you a steroid shot today to help with inflammation.  This may increase your blood sugar temporarily so avoid foods high in sugar for the next few days.  The urine sample today shows a UTI as well.  Take the antibiotic (Keflex ) twice daily for 5 days as prescribed to  treat it.  We will contact you if the urine culture shows we need to change the antibiotic.    Recommend close follow up with PCP if symptoms do not improve with treatment.  If symptoms worsen, seek care emergently.     ED Prescriptions     Medication Sig Dispense Auth. Provider   tiZANidine (ZANAFLEX) 2 MG tablet Take 1 tablet (2 mg total) by mouth every 8 (eight) hours as needed for muscle spasms. 30 tablet Chandra Raisin A, NP   cephALEXin  (KEFLEX ) 500 MG capsule Take 1 capsule (500 mg total) by mouth 2 (two) times daily for 5 days. 10 capsule Chandra Raisin LABOR, NP      PDMP not reviewed this encounter.   Chandra Raisin LABOR, NP 06/21/24 1016

## 2024-06-21 NOTE — ED Triage Notes (Signed)
 Patient states that she's having right side back and leg pain x 1 week. Patient states that the pain radiates down her leg. Patient states that she's been taking tylenol  with no relief. Last dose about 2 hrs ago.

## 2024-06-21 NOTE — Discharge Instructions (Addendum)
 The pain in your right side is consistent with sciatic back pain.  Recommend light range of motion and stretching exercises and start the muscle relaxant medication we have prescribed for you today.  We gave you a steroid shot today to help with inflammation.  This may increase your blood sugar temporarily so avoid foods high in sugar for the next few days.  The urine sample today shows a UTI as well.  Take the antibiotic (Keflex ) twice daily for 5 days as prescribed to treat it.  We will contact you if the urine culture shows we need to change the antibiotic.    Recommend close follow up with PCP if symptoms do not improve with treatment.  If symptoms worsen, seek care emergently.

## 2024-06-24 ENCOUNTER — Ambulatory Visit (HOSPITAL_COMMUNITY): Payer: Self-pay

## 2024-06-24 LAB — URINE CULTURE: Culture: 80000 — AB
